# Patient Record
Sex: Female | Born: 1985 | Hispanic: No | Marital: Married | Smoking: Never smoker
Health system: Southern US, Community
[De-identification: ages and names within clinical notes are randomized; demographics above are authoritative.]

## PROBLEM LIST (undated history)

## (undated) DIAGNOSIS — Z789 Other specified health status: Secondary | ICD-10-CM

## (undated) HISTORY — DX: Other specified health status: Z78.9

## (undated) HISTORY — PX: EYE SURGERY: SHX253

---

## 2014-11-30 ENCOUNTER — Emergency Department (HOSPITAL_COMMUNITY)
Admission: EM | Admit: 2014-11-30 | Discharge: 2014-11-30 | Disposition: A | Discharge status: Home / Self Care | Payer: Medicaid Other | Attending: Emergency Medicine | Admitting: Emergency Medicine

## 2014-11-30 ENCOUNTER — Encounter (HOSPITAL_COMMUNITY): Payer: Self-pay | Admitting: Nurse Practitioner

## 2014-11-30 DIAGNOSIS — R21 Rash and other nonspecific skin eruption: Secondary | ICD-10-CM | POA: Diagnosis present

## 2014-11-30 MED ORDER — TRIAMCINOLONE ACETONIDE 0.1 % EX CREA: 1.0000 "application " | TOPICAL_CREAM | Freq: Two times a day (BID) | CUTANEOUS | Status: DC

## 2014-11-30 MED ORDER — PREDNISONE 20 MG PO TABS: 40.0000 mg | ORAL_TABLET | Freq: Every day | ORAL | Status: DC

## 2014-11-30 NOTE — ED Notes (Addendum)
She c/o "hot itchy rash" to bilateral hands x 1 week. She tried hydrocortisone cream, clariting with no relief. No rash noted to rest of body. She speaks nepali.

## 2014-11-30 NOTE — ED Provider Notes (Signed)
CSN: [id number]     Arrival date & time 11/30/14  1131 History  By signing my name below, I, Freida Busman, attest that this documentation has been prepared under the direction and in the presence of non-physician practitioner, Sharilyn Sites, PA-C. Electronically Signed: Freida Busman, Scribe. 11/30/2014. 12:27 PM.  Chief Complaint  Patient presents with  . Rash    The history is provided by the patient. A language interpreter was used.   HPI Comments:  Brooke Hobbs is a 29 y.o. female who presents to the Emergency Department complaining of burning pruritic rash to bilateral dorsal hands for 2 weeks. She applied topical cream and has taken OTC meds with mild relief. She denies use of new detergents and lotions.  No chemical exposures.  Pt has no other complaints or symptoms at this time.  No fever, chills, sweats. No known allergens.  VSS.  Pt speaks Nepali; Language line utilized to obtain history  History reviewed. No pertinent past medical history. History reviewed. No pertinent past surgical history. History reviewed. No pertinent family history. Social History  Substance Use Topics  . Smoking status: Never Smoker   . Smokeless tobacco: None  . Alcohol Use: No   OB History    No data available     Review of Systems  Constitutional: Negative for fever and chills.  Respiratory: Negative for shortness of breath.   Cardiovascular: Negative for chest pain.  Skin: Positive for rash.  All other systems reviewed and are negative.   Allergies  Review of patient's allergies indicates no known allergies.  Home Medications   Prior to Admission medications   Not on File   BP 122/93 mmHg  Pulse 87  Temp(Src) 97.8 F (36.6 C) (Oral)  Resp 16  Wt 123 lb 6.4 oz (55.974 kg)  SpO2 100%   Physical Exam  Constitutional: She is oriented to person, place, and time. She appears well-developed and well-nourished.  HENT:  Head: Normocephalic and atraumatic.  Mouth/Throat:  Oropharynx is clear and moist.  No oral lesions, no oral swelling, handling secretions well  Eyes: Conjunctivae and EOM are normal. Pupils are equal, round, and reactive to light.  Neck: Normal range of motion.  Cardiovascular: Normal rate, regular rhythm and normal heart sounds.   Pulmonary/Chest: Effort normal and breath sounds normal. No respiratory distress. She has no wheezes.  Musculoskeletal: Normal range of motion.  Neurological: She is alert and oriented to person, place, and time.  Skin: Skin is warm and dry. Rash noted. Rash is maculopapular.  Maculopapular rash noted to bilateral dorsal hands, areas of excoriation noted; no signs of superimposed infection or cellulitis; no abscess formation; no streaking of hands/arms; no lesions on palms; no other rashes noted patient repetitively scratching during exam  Psychiatric: She has a normal mood and affect.  Nursing note and vitals reviewed.   ED Course  Procedures   DIAGNOSTIC STUDIES:  Oxygen Saturation is 100% on RA, normal by my interpretation.    COORDINATION OF CARE:  12:22 PM Will discharge with topical and oral medications as well as OB and PCP referral. Discussed treatment plan with pt at bedside and pt agreed to plan.   MDM   Final diagnoses:  Rash of hands   29 year old female here with pruritic rash to bilateral dorsal hands for the past 2 weeks.  Rash is noninfectious in appearance. She has no lesions on her palms. No oral lesions or oral swelling. Patient is repetitively scratching her hands during exam. This is most  consistent with contact dermatitis. We'll start her on triamcinolone ointment as well as prednisone taper. She was encouraged to take Benadryl for itching. Patient also requests referral to OB/GYN as her husband are having issues getting pregnant over the past several years. She denies any current gynecological issues, but would like evaluation for infertility. [facility] I have also given her referral to wellness clinic for follow-up of rash.  Discussed plan with patient, he/she acknowledged understanding and agreed with plan of care.  Return precautions given for new or worsening symptoms.  I personally performed the services described in this documentation, which was scribed in my presence. The recorded information has been reviewed and is accurate.  Garlon HatchetLisa M Antonious Omahoney, PA-C 11/30/14 1336  Mancel BaleElliott Wentz, MD 11/30/14 1921

## 2014-11-30 NOTE — Discharge Instructions (Signed)
Take the prescribed medication as directed. [facility] of [facility] call to make appt. Return to the ED for new or worsening symptoms.

## 2015-02-28 ENCOUNTER — Encounter: Payer: Self-pay | Admitting: Obstetrics and Gynecology

## 2015-02-28 ENCOUNTER — Ambulatory Visit (INDEPENDENT_AMBULATORY_CARE_PROVIDER_SITE_OTHER): Payer: Medicaid Other | Admitting: Obstetrics and Gynecology

## 2015-02-28 VITALS — BP 124/86 | HR 97 | Temp 98.8°F | Ht <= 58 in | Wt 121.7 lb

## 2015-02-28 DIAGNOSIS — N915 Oligomenorrhea, unspecified: Secondary | ICD-10-CM | POA: Insufficient documentation

## 2015-02-28 HISTORY — DX: Oligomenorrhea, unspecified: N91.5

## 2015-02-28 LAB — TSH: TSH: 1.38 m[IU]/L

## 2015-02-28 LAB — POCT PREGNANCY, URINE: Preg Test, Ur: NEGATIVE

## 2015-02-28 MED ORDER — METFORMIN HCL 1000 MG PO TABS: 1000.0000 mg | ORAL_TABLET | Freq: Two times a day (BID) | ORAL | Status: DC

## 2015-02-28 NOTE — Progress Notes (Signed)
Used [employer] [phone].  Patient states has always had irregular periods. Has been trying to get pregnant x 5-6 years.

## 2015-02-28 NOTE — Progress Notes (Signed)
Patient ID: Brooke Hobbs, female   DOB: 27-Jun-1985, 30 y.o.   MRN: [id number] 30 yo here for the evaluation of irregular menses. Patient reports irregular menses since menarche. She typically has a period every 1- 4 months. Her periods last 4-6 days. She has been prescribed OCP in the past to help regulate her cycle. Her last period was in September. She has been trying to conceive without success for the past 5 years. She denies frequent headaches, visual changes or female pattern hair distribution. She denies milky discharge from her breast  History reviewed. No pertinent past medical history. History reviewed. No pertinent past surgical history. History reviewed. No pertinent family history. Social History  Substance Use Topics  . Smoking status: Never Smoker   . Smokeless tobacco: Former Neurosurgeon    Types: Chew  . Alcohol Use: No   ROS See pertinent in HPI  Blood pressure 124/86, pulse 97, temperature 98.8 F (37.1 C), height  (1.422 m), weight 121 lb 11.2 oz (55.203 kg), last menstrual period 09/28/2014. GENERAL: Well-developed, well-nourished female in no acute distress. Obese ABDOMEN: Soft, nontender, nondistended. PELVIC: Normal external female genitalia. Vagina is pink and rugated.  Normal discharge. Normal appearing cervix. Uterus is normal in size. No adnexal mass or tenderness. EXTREMITIES: No cyanosis, clubbing, or edema, 2+ distal pulses.  A/P 30 yo with oligomenorrhea - TSH, prolactin, LH and FSH ordered - Pelvic ultrasound ordered - Rx Metformin provided for now - Patient will have her husband tested as well - Patient will be contacted with any abnormal results

## 2015-03-01 LAB — LUTEINIZING HORMONE: LH: 18.9 m[IU]/mL

## 2015-03-01 LAB — FOLLICLE STIMULATING HORMONE: FSH: 5.2 m[IU]/mL

## 2015-03-01 LAB — PROLACTIN: Prolactin: 7.1 ng/mL

## 2015-03-05 ENCOUNTER — Telehealth: Payer: Self-pay | Admitting: *Deleted

## 2015-03-05 NOTE — Telephone Encounter (Signed)
Called pt w/[employer] interpreter # [phone]. She was informed that Dr. Jolayne Panther has reviewed her lab tests from last week and all results were normal. Pt should keep appt for [place] as scheduled on 2/20. Dr. Jolayne Panther would like pt to return for follow up visit on 3/10 @ 0900. Pt agreed and voiced understanding of all information and instructions.

## 2015-03-11 ENCOUNTER — Ambulatory Visit (HOSPITAL_COMMUNITY)
Admission: RE | Admit: 2015-03-11 | Discharge: 2015-03-11 | Disposition: A | Discharge status: Home / Self Care | Payer: Self-pay | Source: Ambulatory Visit | Attending: Obstetrics and Gynecology | Admitting: Obstetrics and Gynecology

## 2015-03-11 DIAGNOSIS — N915 Oligomenorrhea, unspecified: Secondary | ICD-10-CM | POA: Insufficient documentation

## 2015-03-29 ENCOUNTER — Ambulatory Visit: Payer: Self-pay | Admitting: Obstetrics and Gynecology

## 2015-04-18 ENCOUNTER — Encounter: Payer: Self-pay | Admitting: Obstetrics and Gynecology

## 2015-04-18 ENCOUNTER — Ambulatory Visit (INDEPENDENT_AMBULATORY_CARE_PROVIDER_SITE_OTHER): Payer: Self-pay | Admitting: Obstetrics and Gynecology

## 2015-04-18 VITALS — BP 118/89 | HR 89 | Temp 98.4°F | Ht <= 58 in | Wt 120.2 lb

## 2015-04-18 DIAGNOSIS — N915 Oligomenorrhea, unspecified: Secondary | ICD-10-CM

## 2015-04-18 MED ORDER — MEDROXYPROGESTERONE ACETATE 10 MG PO TABS: 10.0000 mg | ORAL_TABLET | Freq: Every day | ORAL | Status: DC

## 2015-04-18 MED ORDER — CLOMIPHENE CITRATE 50 MG PO TABS: ORAL_TABLET | ORAL | Status: DC

## 2015-04-18 NOTE — Progress Notes (Signed)
Patient ID: Brooke Hobbs, female   DOB: 01/14/1986, 30 y.o.   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 Patient reports onset of a normal period on 03/22/2015. Patient is still interested in conceiving.  Blood pressure 118/89, pulse 89, temperature 98.4 F (36.9 C), temperature source Oral, height 4\' 10"  (1.473 m), weight 120 lb 3.2 oz (54.522 kg), last menstrual period 03/22/2015. GENERAL: Well-developed, well-nourished female in no acute distress.  EXTREMITIES: No cyanosis, clubbing, or edema, 2+ distal pulses. NEURO: Alert and oriented x 3. No focal deficits  2/20 Ultrasound FINDINGS: Uterus  Measurements: 6.4 x 2.9 x 3.8 cm. No fibroids or other mass visualized.  Endometrium  Thickness: 6 mm in thickness. No focal abnormality visualized.  Right ovary  Measurements: 2.7 x 2.0 x 1.6 cm. Normal appearance/no adnexal mass.  Left ovary  Measurements: 2.4 x 1.9 x 1.8 cm. Normal appearance/no adnexal mass.  Other findings  Small amount of free fluid in the pelvis  IMPRESSION: Unremarkable pelvic ultrasound.   A/P 30 yo with oligomenorrhea - Lab and ultrasound results reviewed and explained - Patient is interested in trying clomid. Rx 50 mg provided along with Provera 10 mg to initiate a menses - Instructions were reviewed with the patient who was able to repeat theses instructions to me - Patient was informed that her husband should have a semen analysis if this fails and that she will be transferred to an infertility specialist

## 2015-04-18 NOTE — Progress Notes (Signed)
[employer]pre[employer] [phone]

## 2015-05-02 ENCOUNTER — Ambulatory Visit
Admission: RE | Admit: 2015-05-02 | Discharge: 2015-05-02 | Disposition: A | Discharge status: Home / Self Care | Payer: No Typology Code available for payment source | Source: Ambulatory Visit | Attending: Infectious Disease | Admitting: Infectious Disease

## 2015-05-02 ENCOUNTER — Other Ambulatory Visit: Payer: Self-pay | Admitting: Infectious Disease

## 2015-05-02 DIAGNOSIS — R7611 Nonspecific reaction to tuberculin skin test without active tuberculosis: Secondary | ICD-10-CM

## 2016-12-14 ENCOUNTER — Ambulatory Visit (HOSPITAL_COMMUNITY)
Admission: EM | Admit: 2016-12-14 | Discharge: 2016-12-14 | Disposition: A | Discharge status: Home / Self Care | Payer: Self-pay | Attending: Physician Assistant | Admitting: Physician Assistant

## 2016-12-14 ENCOUNTER — Ambulatory Visit (INDEPENDENT_AMBULATORY_CARE_PROVIDER_SITE_OTHER): Payer: Self-pay

## 2016-12-14 ENCOUNTER — Encounter (HOSPITAL_COMMUNITY): Payer: Self-pay | Admitting: Emergency Medicine

## 2016-12-14 DIAGNOSIS — J40 Bronchitis, not specified as acute or chronic: Secondary | ICD-10-CM

## 2016-12-14 MED ORDER — ALBUTEROL SULFATE HFA 108 (90 BASE) MCG/ACT IN AERS: 1.0000 | INHALATION_SPRAY | Freq: Four times a day (QID) | RESPIRATORY_TRACT | 0 refills | Status: DC | PRN

## 2016-12-14 MED ORDER — AZITHROMYCIN 250 MG PO TABS: 250.0000 mg | ORAL_TABLET | Freq: Every day | ORAL | 0 refills | Status: DC

## 2016-12-14 MED ORDER — PREDNISONE 10 MG PO TABS: 40.0000 mg | ORAL_TABLET | Freq: Every day | ORAL | 0 refills | Status: AC

## 2016-12-14 NOTE — ED Triage Notes (Signed)
Pt here with cough and some blood tinged sputum at times; pt sts pain with cough

## 2016-12-14 NOTE — ED Provider Notes (Signed)
[facility]    CSN: [id number] Arrival date & time: 12/14/16  1050     History   Chief Complaint Chief Complaint  Patient presents with  . Cough    HPI Brooke Hobbs is a 31 y.o. female.   31 year-old female, presenting today due to cough. She states that she has had cough for the past two weeks. Over the past few days, she has noticed assoicatec chest pain with coughing as well as blood-tinged sputum. Husband at home with similar symptoms. No fever, chills, runny nose   The history is provided by the patient.  Cough  Cough characteristics:  Productive Sputum characteristics:  Clear and bloody Severity:  Mild Onset quality:  Gradual Duration:  2 weeks Timing:  Constant Progression:  Unchanged Chronicity:  New Smoker: no   Context: sick contacts   Context: not animal exposure, not exposure to allergens and not fumes   Relieved by:  Nothing Worsened by:  Nothing Ineffective treatments:  None tried Associated symptoms: chest pain   Associated symptoms: no chills, no diaphoresis, no ear fullness, no ear pain, no eye discharge, no fever, no headaches, no myalgias, no rash, no rhinorrhea, no shortness of breath, no sinus congestion, no sore throat, no weight loss and no wheezing   Risk factors: no chemical exposure, no recent infection and no recent travel     History reviewed. No pertinent past medical history.  Patient Active Problem List   Diagnosis Date Noted  . Oligomenorrhea 02/28/2015    History reviewed. No pertinent surgical history.  OB History    Gravida Para Term Preterm AB Living   0 0 0 0 0 0   SAB TAB Ectopic Multiple Live Births   0 0 0 0         Home Medications    Prior to Admission medications   Medication Sig Start Date End Date Taking? Authorizing Provider  albuterol (PROVENTIL HFA;VENTOLIN HFA) 108 (90 Base) MCG/ACT inhaler Inhale 1-2 puffs into the lungs every 6 (six) hours as needed for wheezing or shortness of breath.  12/14/16   Jasa Dundon C, PA-C  azithromycin (ZITHROMAX) 250 MG tablet Take 1 tablet (250 mg total) by mouth daily. Take first 2 tablets together, then 1 every day until finished. 12/14/16   Simran Mannis C, PA-C  clomiPHENE (CLOMID) 50 MG tablet Take 1 tablet daily for 5 days starting on day 5 of menstrual cycle 04/18/15   Constant, Peggy, MD  medroxyPROGESTERone (PROVERA) 10 MG tablet Take 1 tablet (10 mg total) by mouth daily. 04/18/15   Constant, Peggy, MD  metFORMIN (GLUCOPHAGE) 1000 MG tablet Take 1 tablet (1,000 mg total) by mouth 2 (two) times daily with a meal. 02/28/15   Constant, Peggy, MD  predniSONE (DELTASONE) 10 MG tablet Take 4 tablets (40 mg total) by mouth daily for 5 days. 12/14/16 12/19/16  Alecia LemmingBlue, Aceson Labell C, PA-C    Family History History reviewed. No pertinent family history.  Social History Social History   Tobacco Use  . Smoking status: Never Smoker  . Smokeless tobacco: Former NeurosurgeonUser    Types: Chew  Substance Use Topics  . Alcohol use: No  . Drug use: No     Allergies   Patient has no known allergies.   Review of Systems Review of Systems  Constitutional: Negative for chills, diaphoresis, fever and weight loss.  HENT: Negative for ear pain, rhinorrhea and sore throat.   Eyes: Negative for pain, discharge and visual disturbance.  Respiratory:  Positive for cough. Negative for shortness of breath and wheezing.   Cardiovascular: Positive for chest pain. Negative for palpitations.  Gastrointestinal: Negative for abdominal pain and vomiting.  Genitourinary: Negative for dysuria and hematuria.  Musculoskeletal: Negative for arthralgias, back pain and myalgias.  Skin: Negative for color change and rash.  Neurological: Negative for seizures, syncope and headaches.  All other systems reviewed and are negative.    Physical Exam Triage Vital Signs ED Triage Vitals [12/14/16 1119]  Enc Vitals Group     BP 113/75     Pulse Rate 98     Resp 18     Temp 98.7 F (37.1  C)     Temp Source Oral     SpO2 100 %     Weight      Height      Head Circumference      Peak Flow      Pain Score      Pain Loc      Pain Edu?      Excl. in GC?    No data found.  Updated Vital Signs BP 113/75 (BP Location: Left Arm)   Pulse 98   Temp 98.7 F (37.1 C) (Oral)   Resp 18   LMP 12/13/2016 (Exact Date)   SpO2 100%   Visual Acuity Right Eye Distance:   Left Eye Distance:   Bilateral Distance:    Right Eye Near:   Left Eye Near:    Bilateral Near:     Physical Exam  Constitutional: She appears well-developed and well-nourished. No distress.  HENT:  Head: Normocephalic and atraumatic.  Right Ear: Hearing, tympanic membrane, external ear and ear canal normal.  Left Ear: Hearing, tympanic membrane, external ear and ear canal normal.  Nose: Nose normal.  Mouth/Throat: Uvula is midline and oropharynx is clear and moist.  Eyes: Conjunctivae are normal.  Neck: Neck supple.  Cardiovascular: Normal rate and regular rhythm.  No murmur heard. Pulmonary/Chest: Effort normal and breath sounds normal. No stridor. No respiratory distress. She has no decreased breath sounds. She has no wheezes. She has no rhonchi. She has no rales. She exhibits tenderness.    Abdominal: Soft. There is no tenderness.  Musculoskeletal: She exhibits no edema.  Neurological: She is alert.  Skin: Skin is warm and dry.  Psychiatric: She has a normal mood and affect.  Nursing note and vitals reviewed.    UC Treatments / Results  Labs (all labs ordered are listed, but only abnormal results are displayed) Labs Reviewed - No data to display  EKG  EKG Interpretation None       Radiology Dg Chest 2 View  Result Date: 12/14/2016 CLINICAL DATA:  Cough, hemoptysis with blood-tinged sputum at times for 2 weeks, pain with coughing, fever for couple days EXAM: CHEST  2 VIEW COMPARISON:  05/02/2015 FINDINGS: Normal heart size, mediastinal contours, and pulmonary vascularity. Mild  peribronchial thickening, chronic. No pulmonary infiltrate, pleural effusion, or pneumothorax. Bones unremarkable. IMPRESSION: Mild chronic bronchitic changes without infiltrate. Electronically Signed   By: Ulyses SouthwardMark  Boles M.D.   On: 12/14/2016 12:01    Procedures Procedures (including critical care time)  Medications Ordered in UC Medications - No data to display   Initial Impression / Assessment and Plan / UC Course  I have reviewed the triage vital signs and the nursing notes.  Pertinent labs & imaging results that were available during my care of the patient were reviewed by me and considered in my medical decision  making (see chart for details).     CXR shows likely bronchitis  No evidence of pneumonia Will treat with zpak due to duration of symptoms Prednisone and albuterol for cough   Final Clinical Impressions(s) / UC Diagnoses   Final diagnoses:  Bronchitis    ED Discharge Orders        Ordered    predniSONE (DELTASONE) 10 MG tablet  Daily     12/14/16 1217    azithromycin (ZITHROMAX) 250 MG tablet  Daily     12/14/16 1217    albuterol (PROVENTIL HFA;VENTOLIN HFA) 108 (90 Base) MCG/ACT inhaler  Every 6 hours PRN     12/14/16 1217       Controlled Substance Prescriptions [place] Controlled Substance Registry consulted? Not Applicable   Alecia Lemming, [place] 12/14/16 1225

## 2017-07-29 IMAGING — US US PELVIS COMPLETE
1 series · 15 of 25 positions shown · non-contrast
Comparison: None

CLINICAL DATA: Oligo menorrhea. Last menstrual cycle last [REDACTED]

EXAM:
TRANSABDOMINAL AND TRANSVAGINAL ULTRASOUND OF PELVIS
TECHNIQUE: Both transabdominal and transvaginal ultrasound examinations of the
pelvis were performed. Transabdominal technique was performed for
global imaging of the pelvis including uterus, ovaries, adnexal
regions, and pelvic cul-de-sac. It was necessary to proceed with
endovaginal exam following the transabdominal exam to visualize the
uterus, endometrium, ovaries and adnexa .

[Series 1: us pelvis complete · 15 of 31 slices shown]
[im 1/31]
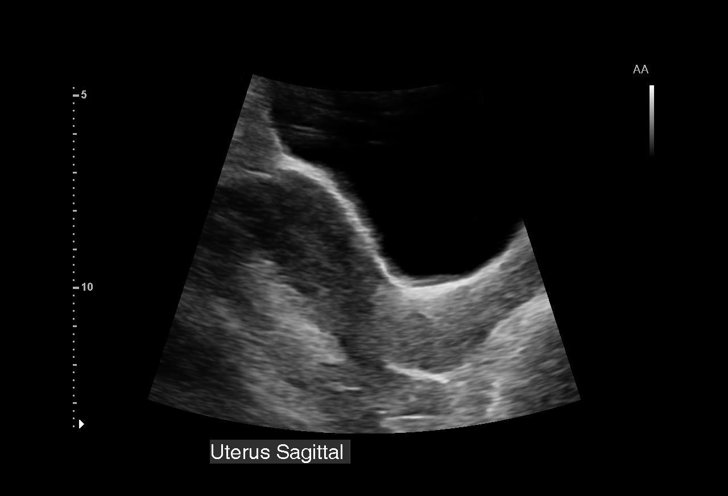
[im 3/31]
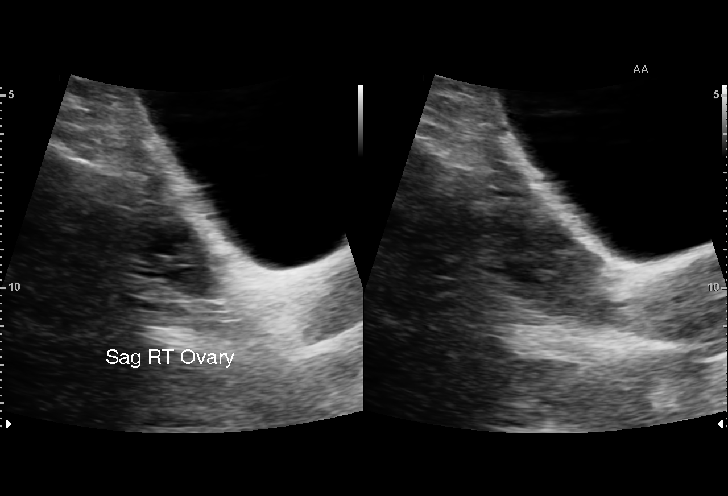
[im 6/31]
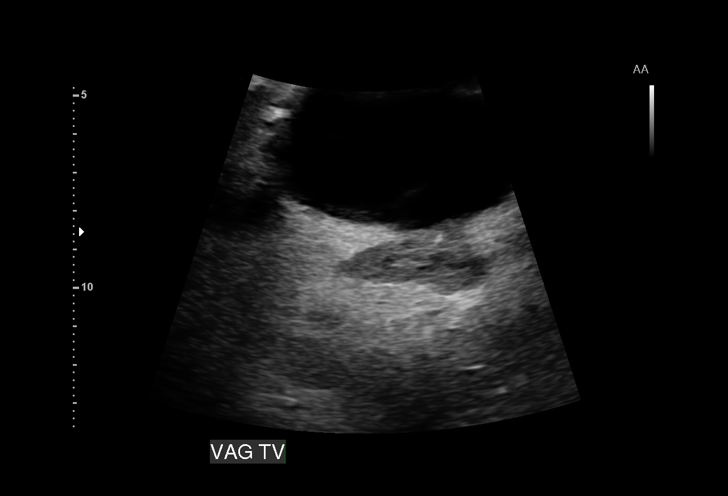
[im 7/31]
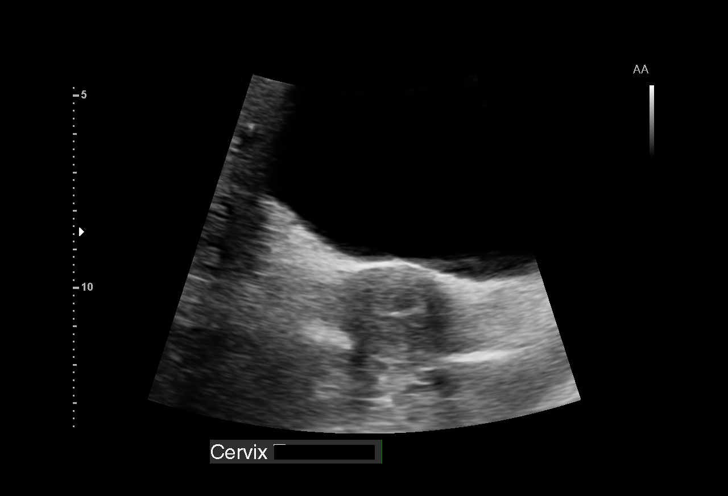
[im 9/31]
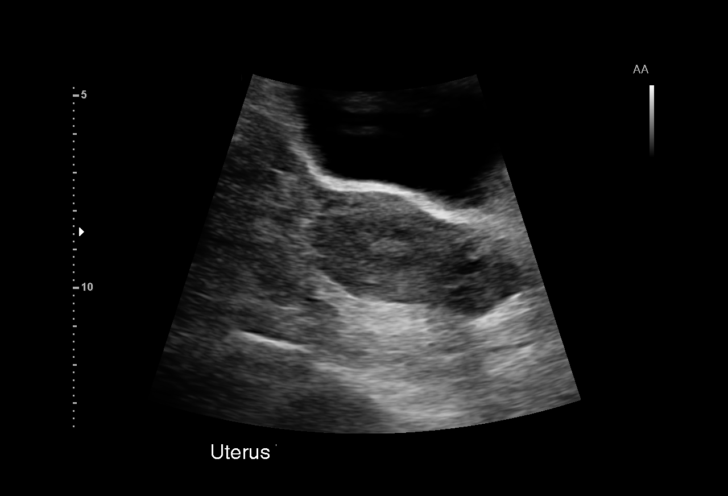
[im 12/31]
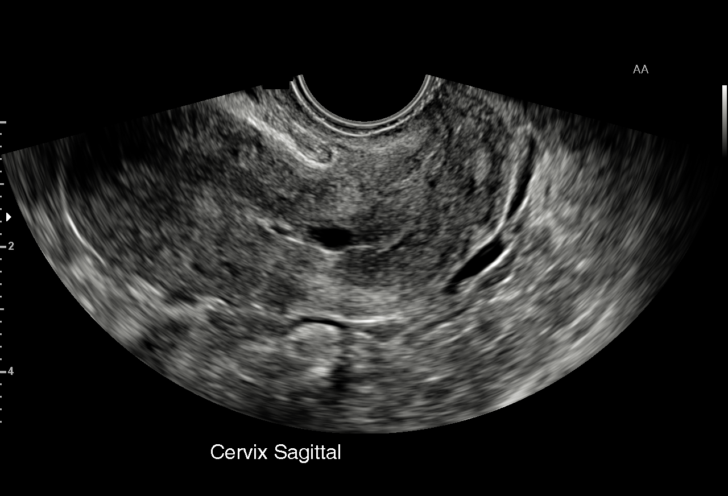
[im 13/31]
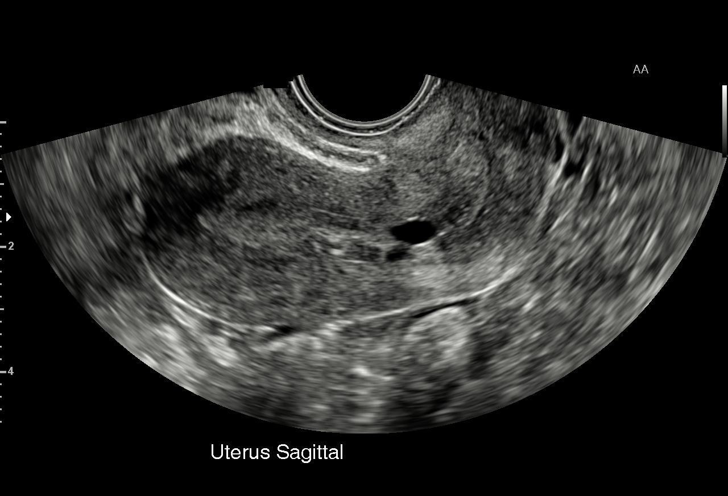
[im 16/31]
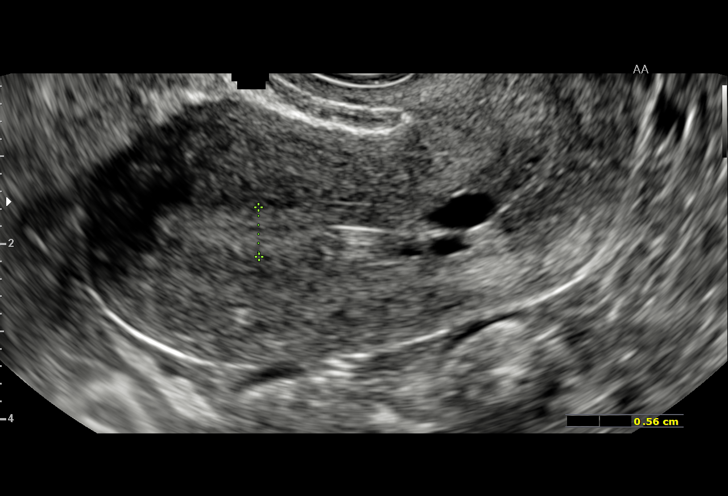
[im 18/31]
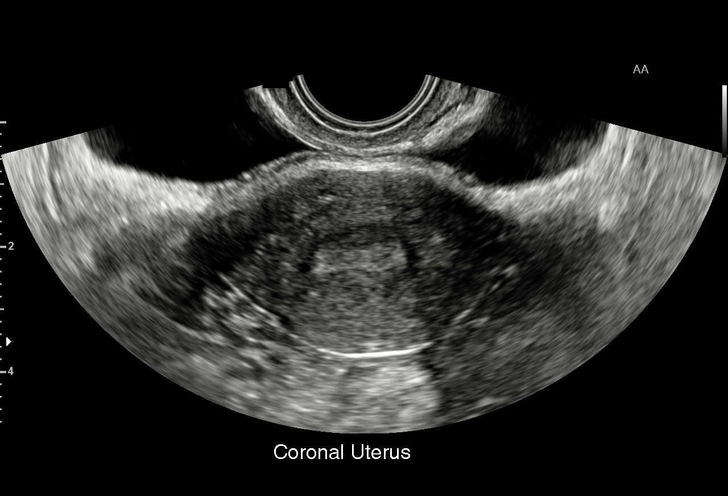
[im 19/31]
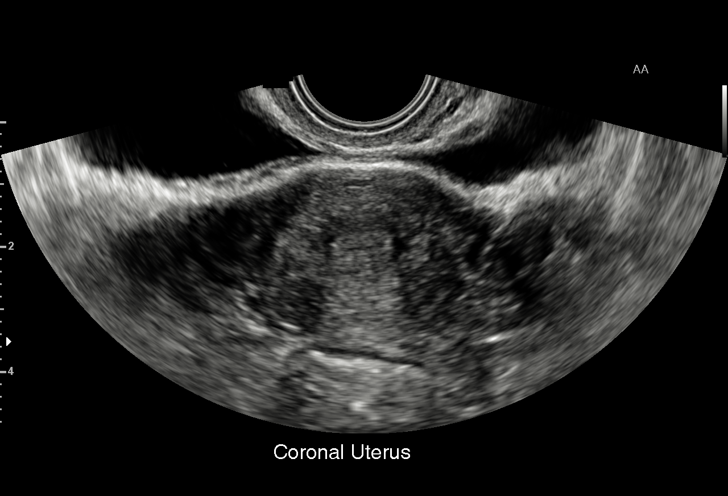
[im 22/31]
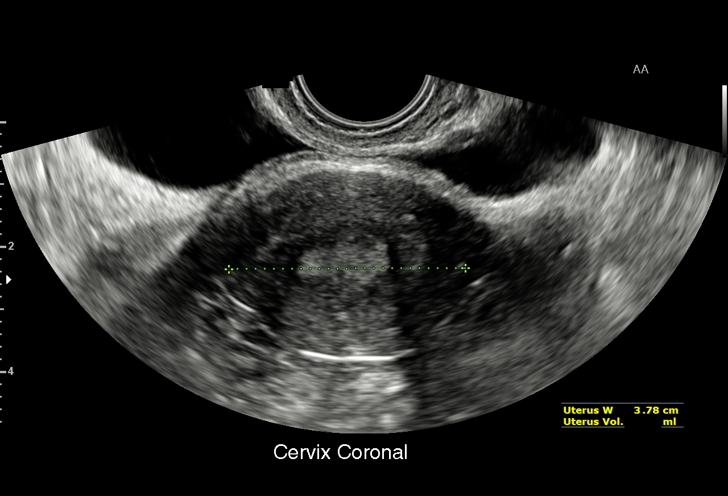
[im 24/31]
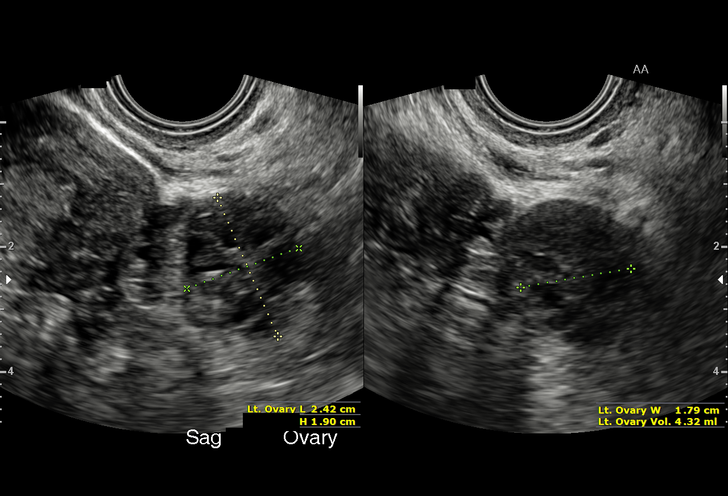
[im 26/31]
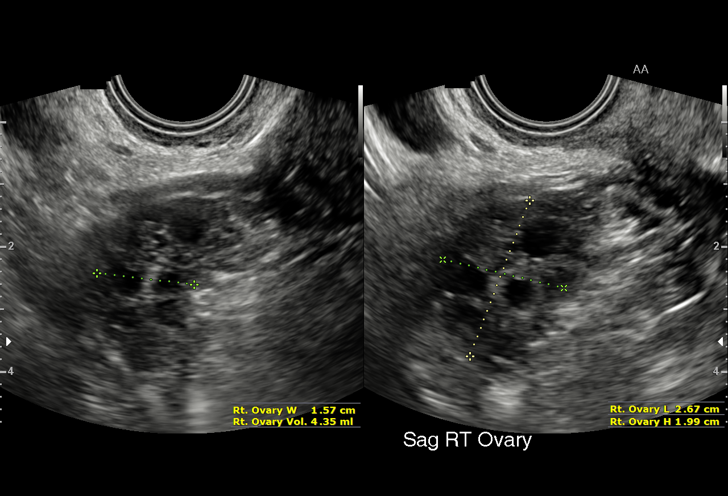
[im 28/31]
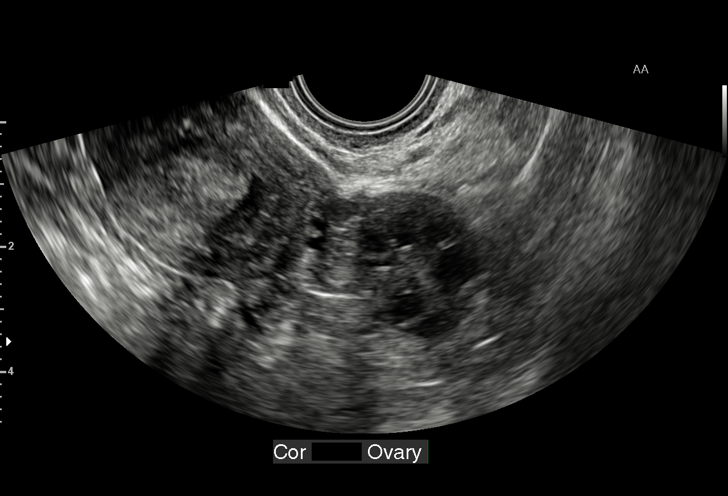
[im 31/31]
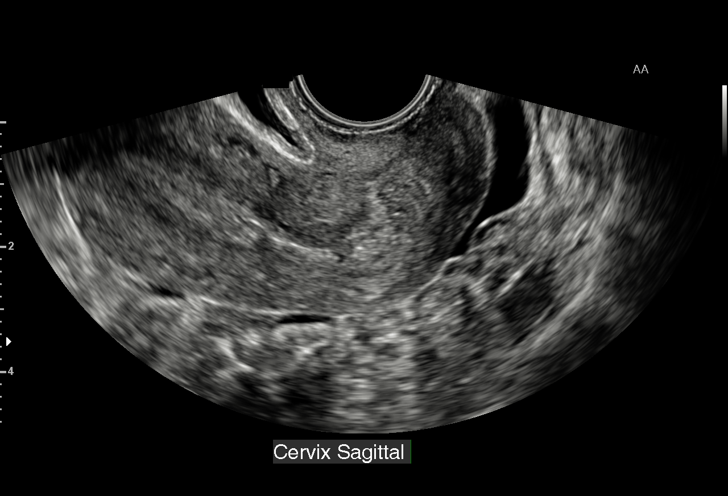

[15 of 25 positions shown; findings below may reference images not displayed]

FINDINGS: Uterus

Measurements: 6.4 x 2.9 x 3.8 cm. No fibroids or other mass
visualized.

Endometrium

Thickness: 6 mm in thickness.  No focal abnormality visualized.

Right ovary

Measurements: 2.7 x 2.0 x 1.6 cm. Normal appearance/no adnexal mass.

Left ovary

Measurements: 2.4 x 1.9 x 1.8 cm. Normal appearance/no adnexal mass.

Other findings

Small amount of free fluid in the pelvis
IMPRESSION: Unremarkable pelvic ultrasound.

## 2018-10-04 ENCOUNTER — Other Ambulatory Visit: Payer: Self-pay

## 2018-10-04 DIAGNOSIS — Z20822 Contact with and (suspected) exposure to covid-19: Secondary | ICD-10-CM

## 2018-10-06 LAB — NOVEL CORONAVIRUS, NAA: SARS-CoV-2, NAA: NOT DETECTED

## 2019-04-10 ENCOUNTER — Ambulatory Visit: Payer: Self-pay | Attending: Internal Medicine

## 2019-04-10 DIAGNOSIS — Z23 Encounter for immunization: Secondary | ICD-10-CM

## 2019-04-10 NOTE — Progress Notes (Signed)
   Covid-19 Vaccination Clinic  Name:  Ranell Finelli    MRN: [id number] DOB: 1985-10-08  04/10/2019  Ms. Stilley was observed post Covid-19 immunization for 15 minutes without incident. She was provided with Vaccine Information Sheet and instruction to access the V-Safe system.   Ms. Whitworth was instructed to call 911 with any severe reactions post vaccine: Marland Kitchen Difficulty breathing  . Swelling of face and throat  . A fast heartbeat  . A bad rash all over body  . Dizziness and weakness   Immunizations Administered    Name Date Dose VIS Date Route   Pfizer COVID-19 Vaccine 04/10/2019 10:38 AM 0.3 mL 12/30/2018 Intramuscular   Manufacturer: [employer], [employer]   Lot: [id number]   NDC: [address]-4

## 2019-05-04 IMAGING — DX DG CHEST 2V
2 series · 2 of 2 positions shown · non-contrast
Comparison: 05/02/2015

CLINICAL DATA: Cough, hemoptysis with blood-tinged sputum at times
for 2 weeks, pain with coughing, fever for couple days

EXAM:
CHEST  2 VIEW

[chest pa]
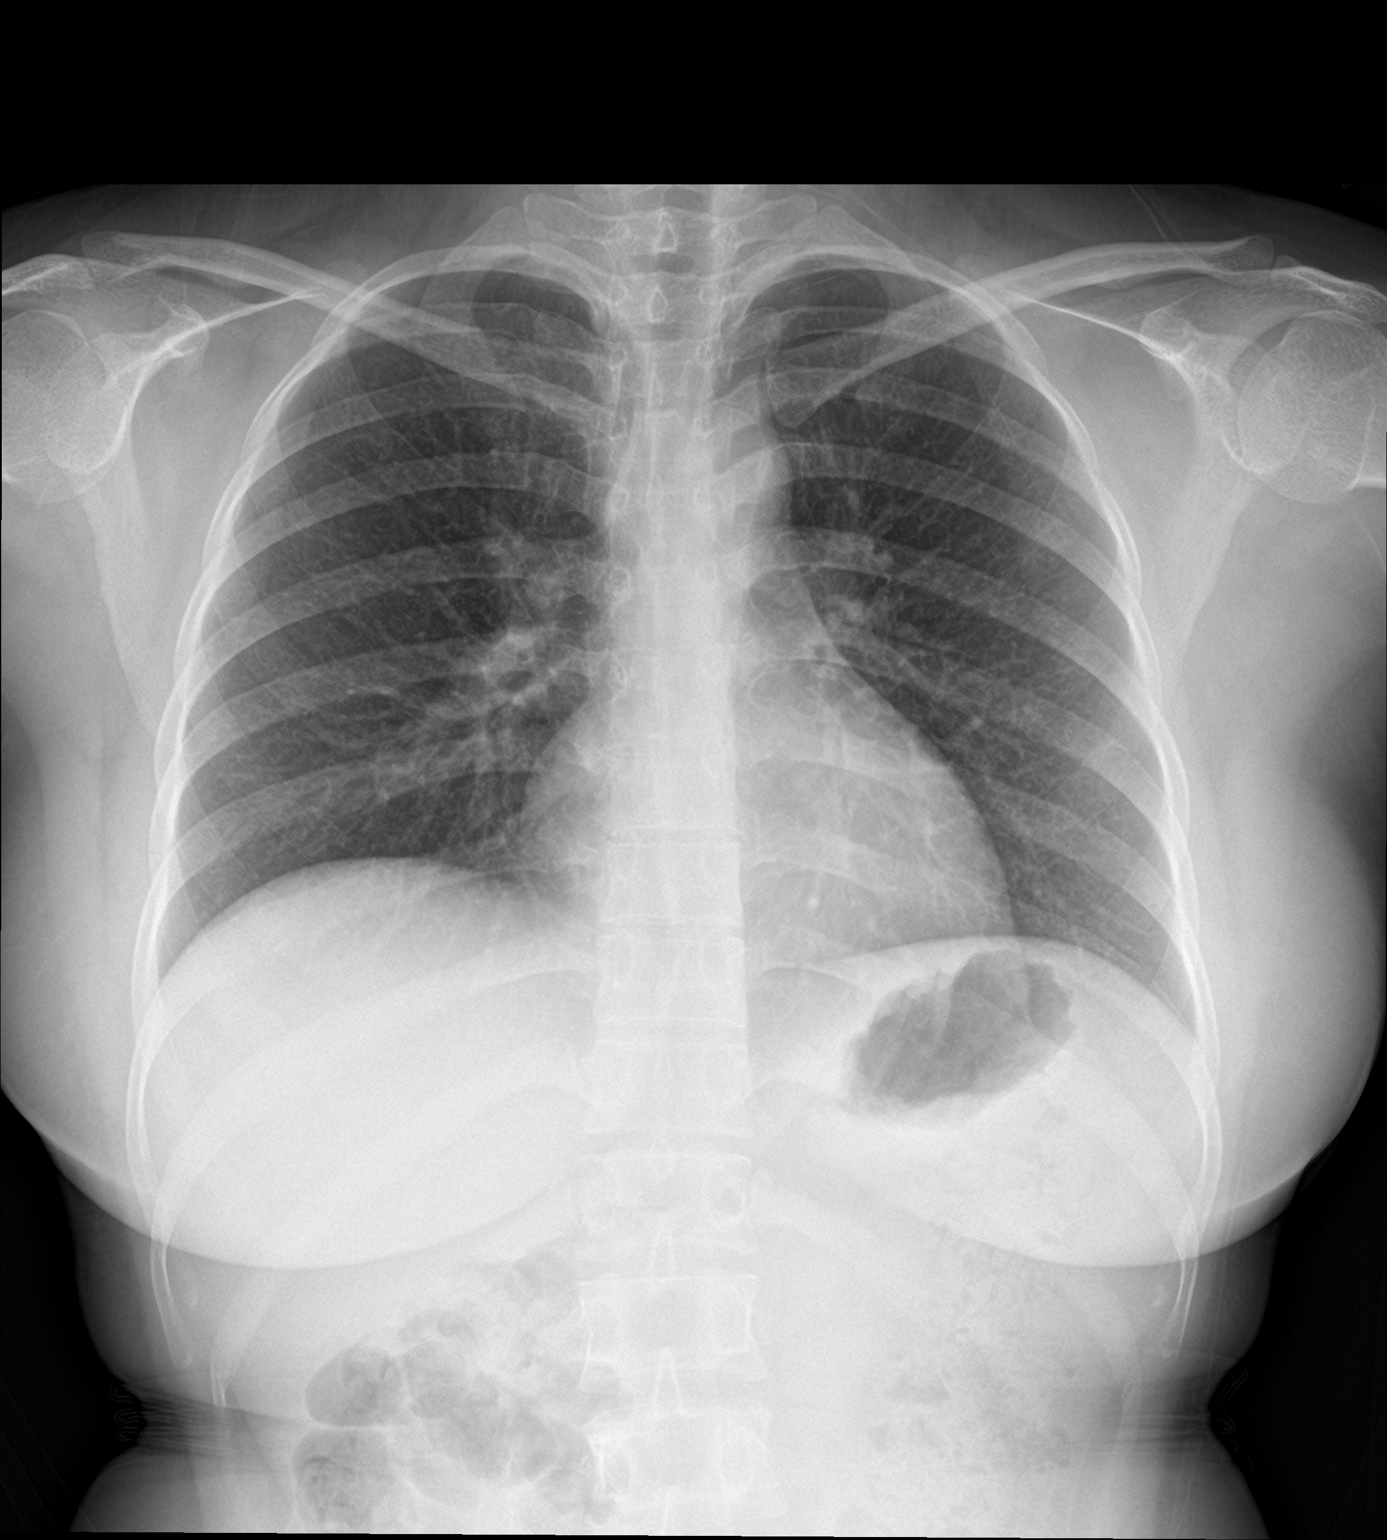

[chest lat]
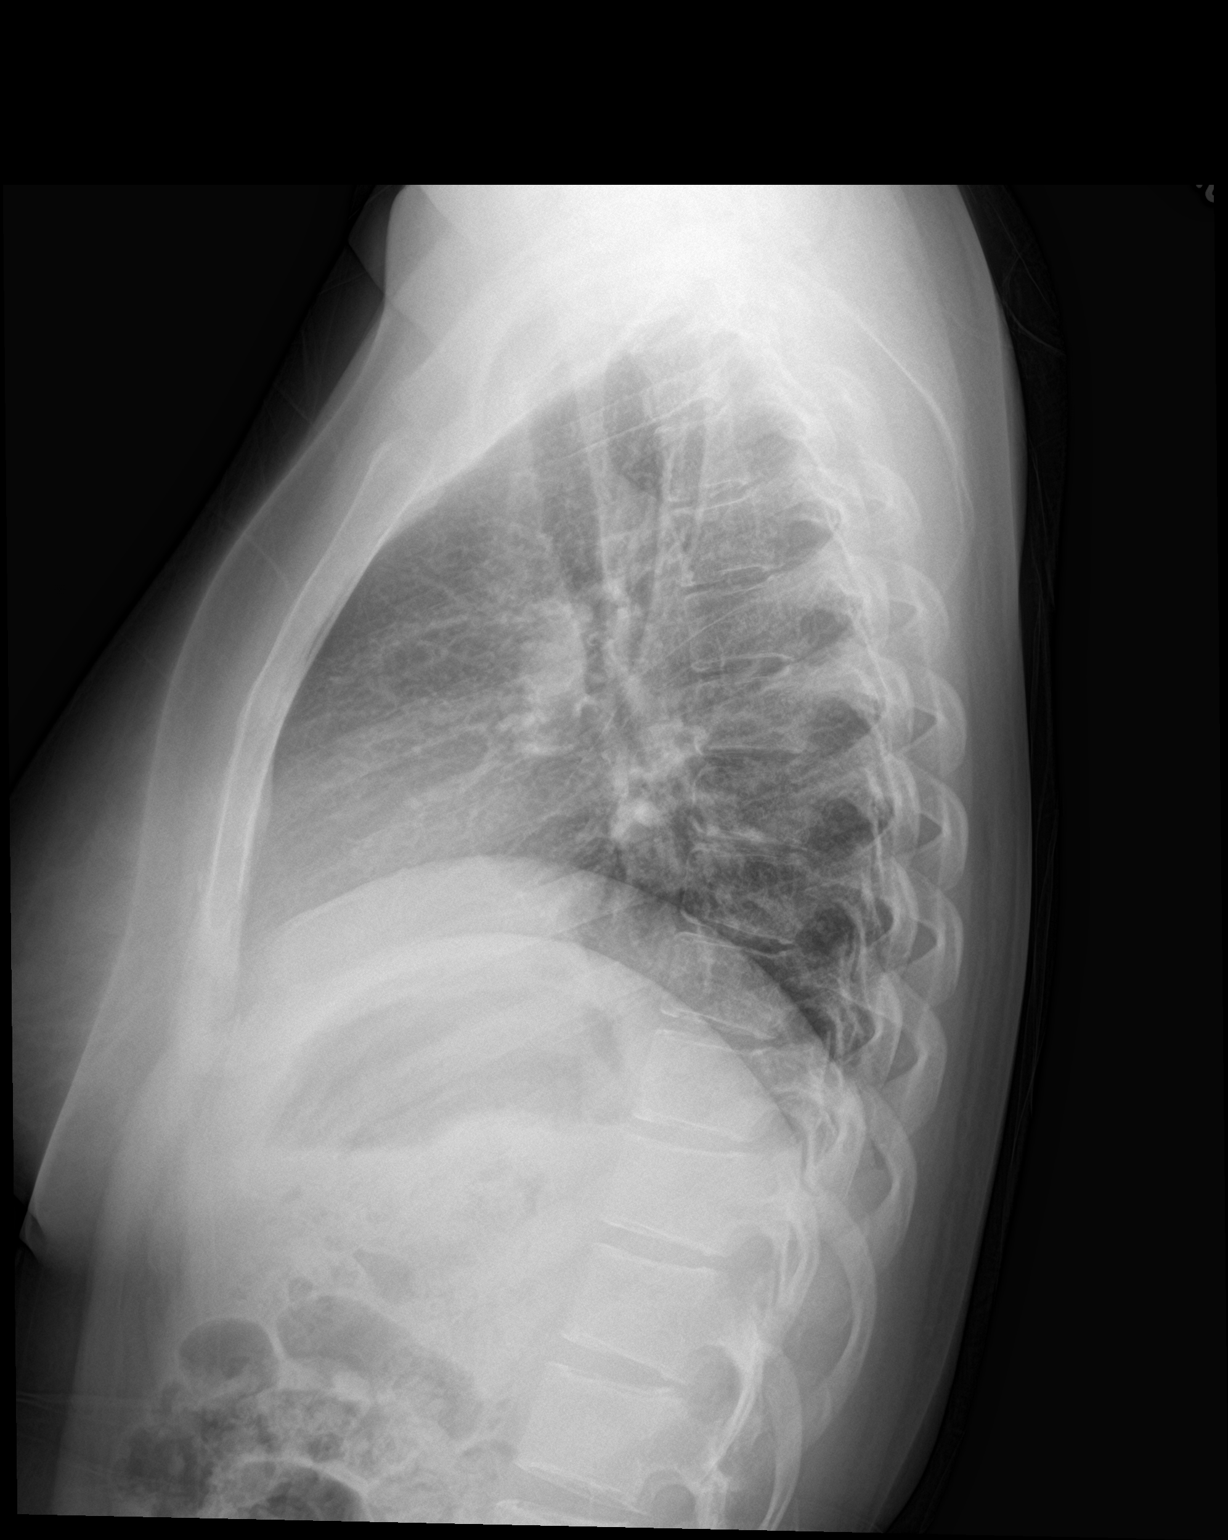

[2 of 2 positions shown; findings below may reference images not displayed]

FINDINGS: Normal heart size, mediastinal contours, and pulmonary vascularity.

Mild peribronchial thickening, chronic.

No pulmonary infiltrate, pleural effusion, or pneumothorax.

Bones unremarkable.
IMPRESSION: Mild chronic bronchitic changes without infiltrate.

## 2019-05-08 ENCOUNTER — Ambulatory Visit: Payer: Self-pay | Attending: Internal Medicine

## 2019-05-08 DIAGNOSIS — Z23 Encounter for immunization: Secondary | ICD-10-CM

## 2019-05-08 NOTE — Progress Notes (Signed)
   Covid-19 Vaccination Clinic  Name:  Dafina Suk    MRN: [id number] DOB: 04/26/1985  05/08/2019  Ms. Crilly was observed post Covid-19 immunization for 15 minutes without incident. She was provided with Vaccine Information Sheet and instruction to access the V-Safe system.   Ms. Matera was instructed to call 911 with any severe reactions post vaccine: Marland Kitchen Difficulty breathing  . Swelling of face and throat  . A fast heartbeat  . A bad rash all over body  . Dizziness and weakness   Immunizations Administered    Name Date Dose VIS Date Route   Pfizer COVID-19 Vaccine 05/08/2019  9:18 AM 0.3 mL 03/15/2018 Intramuscular   Manufacturer: [employer], [employer]   Lot: [id number]   NDC: [address]-4

## 2019-07-18 ENCOUNTER — Ambulatory Visit: Payer: Self-pay | Admitting: Obstetrics and Gynecology

## 2019-08-10 ENCOUNTER — Encounter: Payer: Self-pay | Admitting: Obstetrics and Gynecology

## 2019-08-10 ENCOUNTER — Other Ambulatory Visit (HOSPITAL_COMMUNITY)
Admission: RE | Admit: 2019-08-10 | Discharge: 2019-08-10 | Disposition: A | Discharge status: Home / Self Care | Payer: Self-pay | Source: Ambulatory Visit | Attending: Obstetrics and Gynecology | Admitting: Obstetrics and Gynecology

## 2019-08-10 ENCOUNTER — Ambulatory Visit (INDEPENDENT_AMBULATORY_CARE_PROVIDER_SITE_OTHER): Payer: Self-pay | Admitting: Obstetrics and Gynecology

## 2019-08-10 ENCOUNTER — Other Ambulatory Visit: Payer: Self-pay

## 2019-08-10 VITALS — BP 125/81 | HR 95 | Ht <= 58 in | Wt 131.9 lb

## 2019-08-10 DIAGNOSIS — N914 Secondary oligomenorrhea: Secondary | ICD-10-CM | POA: Insufficient documentation

## 2019-08-10 DIAGNOSIS — N915 Oligomenorrhea, unspecified: Secondary | ICD-10-CM

## 2019-08-10 NOTE — Progress Notes (Signed)
Pt has Live Animator

## 2019-08-10 NOTE — Progress Notes (Signed)
   Subjective:    Patient ID: Brooke Hobbs is a 34 y.o. female presenting with Gynecologic Exam  on 08/10/2019  HPI: Pt still continues with oligomenorrhea.  Through interpreter she notes the progesterone appeared to work 4 years ago, but she discontinued it due to cramping.  Currently, pt has menses q 2-3 months.  Menses usually lasts one day.  Pt denies hot flashes or nipple leakage.  Pt has been trying to get pregnant for 8 years, but still has not evaluated female factor. No past surgical history on file.  No past medical history on file.  No family history on file.   All reviewed   Review of Systems  Constitutional: Negative.   HENT: Negative.   Eyes: Negative.   Respiratory: Negative.   Cardiovascular: Negative.   Gastrointestinal: Negative.   Endocrine: Negative.   Genitourinary: Positive for menstrual problem. Negative for vaginal bleeding.  Musculoskeletal: Negative.   Allergic/Immunologic: Negative.   Neurological: Negative.   Psychiatric/Behavioral: Negative.       Objective:    BP 125/81   Pulse 95   Ht 4\' 9"  (1.448 m)   Wt 131 lb 14.4 oz (59.8 kg)   LMP 02/28/2007 (Approximate)   BMI 28.54 kg/m  Physical Exam Constitutional:      Appearance: Normal appearance.  HENT:     Head: Normocephalic and atraumatic.     Nose: Nose normal.     Mouth/Throat:     Mouth: Mucous membranes are moist.  Eyes:     Extraocular Movements: Extraocular movements intact.  Cardiovascular:     Rate and Rhythm: Normal rate and regular rhythm.     Heart sounds: Normal heart sounds.  Pulmonary:     Effort: Pulmonary effort is normal.     Breath sounds: Normal breath sounds.  Abdominal:     Palpations: Abdomen is soft.     Comments: Very thick abdomen, but no masses detected, no striae noted, no fluid wave  Genitourinary:    General: Normal vulva.     Vagina: No vaginal discharge.     Comments: Pap taken without incident, cvx: WNL, uterus and adnexa WNL, no abnormal discharge  noted Musculoskeletal:        General: Normal range of motion.     Cervical back: Normal range of motion.  Skin:    General: Skin is warm and dry.  Neurological:     General: No focal deficit present.     Mental Status: She is alert and oriented to person, place, and time.  Psychiatric:        Mood and Affect: Mood normal.         Assessment & Plan:  Oligomenorrhea Will repeat TSH, prolactin, FSH, LH and serum preg test If normal will repeat progesterone challenge and retry clomid 3-5 months If ineffective, pt will need fertility referral Pt urged to have partner seek out semen evaluation  Return in about 1 month (around 09/10/2019) for in person, f/u.  09/12/2019 08/10/2019 3:53 PM

## 2019-08-11 LAB — TSH: TSH: 1.8 u[IU]/mL (ref 0.450–4.500)

## 2019-08-11 LAB — BETA HCG QUANT (REF LAB): hCG Quant: <1 m[IU]/mL

## 2019-08-11 LAB — AMENORRHEA PROFILE
FSH: 5.5 m[IU]/mL
LH: 28.3 m[IU]/mL
Prolactin: 10 ng/mL (ref 4.8–23.3)

## 2019-08-14 LAB — CYTOLOGY - PAP
Adequacy: ABSENT
Comment: NEGATIVE
Diagnosis: NEGATIVE
High risk HPV: NEGATIVE

## 2019-09-13 ENCOUNTER — Ambulatory Visit (INDEPENDENT_AMBULATORY_CARE_PROVIDER_SITE_OTHER): Payer: Self-pay | Admitting: Obstetrics and Gynecology

## 2019-09-13 ENCOUNTER — Encounter: Payer: Self-pay | Admitting: Obstetrics and Gynecology

## 2019-09-13 ENCOUNTER — Other Ambulatory Visit: Payer: Self-pay

## 2019-09-13 VITALS — BP 115/88 | HR 98 | Ht <= 58 in | Wt 131.6 lb

## 2019-09-13 DIAGNOSIS — N970 Female infertility associated with anovulation: Secondary | ICD-10-CM

## 2019-09-13 DIAGNOSIS — N914 Secondary oligomenorrhea: Secondary | ICD-10-CM

## 2019-09-13 MED ORDER — CLOMIPHENE CITRATE 50 MG PO TABS: ORAL_TABLET | ORAL | 3 refills | Status: DC

## 2019-09-13 MED ORDER — MEDROXYPROGESTERONE ACETATE 10 MG PO TABS
10.0000 mg | ORAL_TABLET | Freq: Every day | ORAL | 2 refills | Status: DC
Start: 2019-09-13 — End: 2019-10-09

## 2019-09-13 NOTE — Progress Notes (Signed)
   Subjective:    Patient ID: Brooke Hobbs, female    DOB: [birth year], 34 y.o.   MRN: [id number]  HPI  Pt seen regarding oligomenorrhea.  Hormonal panel was normal.  She is not currently pregnant.  Pt has been trying off and on for 8 years to get pregnant.  She states her sister in law has a similar issue with lack of menses.  Discussed with pt progesterone challenge and clomid use in detail.  Also discussed need for semenanalysis for her spouse.    Review of Systems     Objective:   Physical Exam        Assessment & Plan:   1. Secondary oligomenorrhea Take provera with expectation of withdrawal menses 3-4 days after stopping therapy - medroxyPROGESTERone (PROVERA) 10 MG tablet; Take 1 tablet (10 mg total) by mouth daily. Use for ten days  Dispense: 10 tablet; Refill: 2  2. Infertility associated with anovulation Pt advised to get serum progesterone on or around day 14.  Pt advised to have timed intercourse every other day for one week starting on day 14. - clomiPHENE (CLOMID) 50 MG tablet; Take 1 tablet daily for 5 days starting on day 5 of menstrual cycle  Dispense: 5 tablet; Refill: 3   Advise follow up in 3-4 months or with sign of positive pregnancy test  Total face-to-face time with patient: 20 minutes. Over 50% of encounter was spent on counseling and coordination of care.  Mariel Aloe, MD  Faculty attending Center for [facility]

## 2019-09-13 NOTE — Patient Instructions (Signed)
Clomid challenge/therapy Given that patient has anovulatory cycles, discussed Clomid challenge/therapy.  She was given Rx for Provera 10mg po qd x 10 days.   She was told to watch for withdrawal bleeding after the course, the first day of bleeding will be Day 1 for her next cycle.  She was also given a Rx for Clomid 50mg po qd to take on days 5-9 of her cycle.  The risks of Clomid including ovarian hyperstimulation with possible risk of ovarian cancer as well as multiple gestation were discussed with patient.  Patient also advised to continue frequent intercourse especially around Day 14 of her upcoming cycle (qod intercourse around days 9 - 18).  By Day 35, patient was told to call if she had her period.  If patient has bleeding at end of cycle, will increase Clomid by 50mg for the following cycle; she can have a total of 6 cycles.  However if patient does not have bleeding, she was told to do a pregnancy test/come in for evaluation.   CLOMID PATIENT INSTRUCTIONS  WHY USE IT? Clomid helps your ovaries to release eggs (ovulate).  HOW TO USE IT? Clomid is taken as a pill usually on days 5,6,7,8, & 9 of your cycle.  Day 1 is the first day of your period. The dose or duration may be changed to achieve ovulation.  Provera (progesterone) may first be used to bring on a period for some patients.  If you do not get pregnant this cycle, for your next cycles, take on days 1, 2, 3, 4 and 5.  If you do not get a period, take Provera 10 mg daily for 10 days to bring on a period; the first day you get bleeding is Day 1 of your cycle. The day of ovulation on Clomid is usually between cycle day 14 and 17.  Having sexual intercourse at least every other day between cycle day 13 and 18 will improve your chances of becoming pregnant during the Clomid cycle.  You may monitor your ovulation using basal body temperature charts or with ovulation kits.  If using the ovulation predictor kits, having intercourse the day of the  surge and the two days following is recommended. If you get your period, call when it starts for an appointment with your doctor, so that an exam may be done, and another Clomid cycle can be considered if appropriate. If you do not get a period by day 35 of the cycle, please get a blood pregnancy test.  If it is negative, speak to your doctor for instructions to bring on another period and to plan a follow-up appointment.  THINGS TO KNOW: If you get pregnant while using Clomid, your chance of twins is 7% and triplets is less than 1%. Some studies have suggested the use of "fertility drugs" may increase your risk of ovarian cancers in the future.  It is unclear if these drugs increase the risk, or people who have problems with fertility are prone for these cancers.  If there is an actual risk, it is very low.  If you have a history of liver problems or ovarian cancer, it may be wise to avoid this medication.  SIDE EFFECTS: The most common side effect is hot flashes (20%). Breast tenderness, headaches, nausea, bloating may also occur at different times. Less than 3/1,000 people have dryness or loss of hair. Persistent ovarian cysts may form from the use of this medication. Ovarian hyperstimulation syndrome is a rare side effect at low   doses. Visual changes like flashes of light or blurring.      

## 2019-10-07 ENCOUNTER — Other Ambulatory Visit: Payer: Self-pay | Admitting: Obstetrics and Gynecology

## 2019-10-07 DIAGNOSIS — N914 Secondary oligomenorrhea: Secondary | ICD-10-CM

## 2019-10-12 ENCOUNTER — Ambulatory Visit (INDEPENDENT_AMBULATORY_CARE_PROVIDER_SITE_OTHER): Payer: Self-pay | Admitting: Obstetrics and Gynecology

## 2019-10-12 ENCOUNTER — Encounter: Payer: Self-pay | Admitting: Family Medicine

## 2019-10-12 ENCOUNTER — Other Ambulatory Visit: Payer: Self-pay

## 2019-10-12 VITALS — BP 113/67 | HR 96 | Wt 131.0 lb

## 2019-10-12 DIAGNOSIS — Z789 Other specified health status: Secondary | ICD-10-CM | POA: Insufficient documentation

## 2019-10-12 DIAGNOSIS — N914 Secondary oligomenorrhea: Secondary | ICD-10-CM

## 2019-10-12 MED ORDER — MEDROXYPROGESTERONE ACETATE 10 MG PO TABS: 10.0000 mg | ORAL_TABLET | Freq: Every day | ORAL | 6 refills | Status: DC

## 2019-10-12 NOTE — Progress Notes (Signed)
   Subjective:    Patient ID: Brooke Hobbs, female    DOB: March 15, 1985, 34 y.o.   MRN: [id number]  HPI  Pt seen as a follow up.  Pt has taken her provera incorrectly.  Instead of taking the provera 7-10 days, the pt has been taking it continuously and has gone through 2 refills.  The patient never picked up her clomid.  LMP 9/17-9/23.  First menses since February.  Through interpreter , I repeatedly explained how to take the progesterone and the metformin.  Discussed clomid on days 5-9, timed intercourse starting on day 14 and day 21 progesterone  Review of Systems     Objective:   Physical Exam        Assessment & Plan:   Oligomenorrhea : provera 10 mg daily for 7 days once per month  Infertility : start clomid on day 5 of spontaneous cycle or withdrawal bleed.  Pt may call to discuss timing of medications, but she does not need office visit for each menses. Otherwise follow up in 3-4 months. Total face-to-face time with patient: 20 minutes. Over 50% of encounter was spent on counseling and coordination of care.

## 2019-10-12 NOTE — Patient Instructions (Addendum)
[facility] [employer]  Department [facility] [employer] [facility] [employer] [address] [place], [place] [address] [phone]   or  www.https://hall.info/ **SNAP/EBT/ Other nutritional benefits  [facility] [address] [place], [place], [place] [address] [phone]  or  https://king.net/ **WIC for  women who are pregnant and postpartum, infants and children up to 32 years old  [facility] [address], [place], [place] [address] [phone]   or   www.theblessedtable.org  **Food pantry  [employer] [address] [place], [place], [place] [address] [phone]   or   https://brotherkolbes.godaddysites.com  **Emergency food and prepared meals  [address] [place] [employer] [facility] [address], [place], [place] [address] [phone]   or   www.cedargrovetop.us **Food pantry  [facility] [address], [place], [place] [address] [phone]   or   www.GolfingFamily.no **Food pantry  [facility] [address] [place], [place] [address] [phone] **Food pantry  [facility] [address], [place], [place] [address] [phone]   or   www.greensborourbanministry.org  Tour manager and prepared meals  [facility] [address] [place], [address], [place], [place] [address] VerifiedMovies.gl  **Food pantry  [employer] [facility] [address] [place], [place] [address] [phone]   or   www.lbcnow.org  **Food pantry  [employer] [facility] [address], [place], [place] [address] [phone]   or   WorkingMBA.co.nz **Food pantry, nutrition education, gardening activities  [facility] [address], [place], [place] [address] [phone] **Food pantry  [facility] [place] [address] [place], [place] [address] [phone]   or   www.salvationarmyof[place].[employer] [employer] [facility] [place] [address] [place], [place] [address] [phone]   or   http://senior-resources-[place].org [employer] Food on Wheels Program  [facility] [address], [place], [place] [address] [phone]   or   www.stmattchurch.com  **Food pantry  [facility] [address] [place], [place] [address] [phone]   or   vandaliapresbyterianchurch.org **Food pantry  Clomiphene tablets What is this medicine? CLOMIPHENE (KLOE mi feen) is a fertility drug that increases the chance of pregnancy. It helps women ovulate (produce a mature egg) during their cycle. This medicine may be used for other purposes; ask your health care provider or pharmacist if you have questions. COMMON BRAND NAME(S): Clomid, Serophene What should I tell my health care provider before I take this medicine? They need to know if you have any of these conditions:  adrenal gland disease  blood vessel disease or blood clots  cyst on the ovary  endometriosis  liver disease  ovarian cancer  pituitary gland disease  vaginal bleeding that has not been evaluated  an unusual or allergic reaction to clomiphene, other medicines, foods, dyes, or preservatives  pregnant (should not be used if you are already pregnant)  breast-feeding How should I use this medicine? Take this medicine by mouth with a glass of water. Follow the directions on the prescription label. Take exactly as directed for the exact number of days prescribed. Take your doses at regular intervals. Most women take this medicine for a 5 day period, but the length of treatment may be adjusted. Your doctor will give you a start date for this medication and  will give you instructions on proper use. Do not take your medicine more often than directed. Talk to your  pediatrician regarding the use of this medicine in children. Special care may be needed. Overdosage: If you think you have taken too much of this medicine contact a poison control center or emergency room at once. NOTE: This medicine is only for you. Do not share this medicine with others. What if I miss a dose? If you miss a dose, take it as soon as you can. If it is almost time for your next dose, take only that dose. Do not take double or extra doses. What may interact with this medicine?  herbal or dietary supplements, like blue cohosh, black cohosh, chasteberry, or DHEA  prasterone This list may not describe all possible interactions. Give your health care provider a list of all the medicines, herbs, non-prescription drugs, or dietary supplements you use. Also tell them if you smoke, drink alcohol, or use illegal drugs. Some items may interact with your medicine. What should I watch for while using this medicine? Make sure you understand how and when to use this medicine. You need to know when you are ovulating and when to have sexual intercourse. This will increase the chance of a pregnancy. Visit your doctor or health care professional for regular checks on your progress. You may need tests to check the hormone levels in your blood or you may have to use home-urine tests to check for ovulation. Try to keep any appointments. Compared to other fertility treatments, this medicine does not greatly increase your chances of having multiple babies. An increased chance of having twins may occur in roughly 5 out of every 100 women who take this medication. Stop taking this medicine at once and contact your doctor or health care professional if you think you are pregnant. This medicine is not for long-term use. Most women that benefit from this medicine do so within the first three cycles  (months). Your doctor or health care professional will monitor your condition. This medicine is usually used for a total of 6 cycles of treatment. You may get drowsy or dizzy. Do not drive, use machinery, or do anything that needs mental alertness until you know how this drug affects you. Do not stand or sit up quickly. This reduces the risk of dizzy or fainting spells. Drinking alcoholic beverages or smoking tobacco may decrease your chance of becoming pregnant. Limit or stop alcohol and tobacco use during your fertility treatments. What side effects may I notice from receiving this medicine? Side effects that you should report to your doctor or health care professional as soon as possible:  allergic reactions like skin rash, itching or hives, swelling of the face, lips, or tongue  breathing problems  changes in vision  fluid retention  nausea, vomiting  pelvic pain or bloating  severe abdominal pain  sudden weight gain Side effects that usually do not require medical attention (report to your doctor or health care professional if they continue or are bothersome):  breast discomfort  hot flashes  mild pelvic discomfort  mild nausea This list may not describe all possible side effects. Call your doctor for medical advice about side effects. You may report side effects to FDA at 1-800-FDA-1088. Where should I keep my medicine? Keep out of the reach of children. Store at room temperature between 15 and 30 degrees C (59 and 86 degrees F). Protect from heat, light, and moisture. Throw away any unused medicine after the expiration date. NOTE: This sheet is a summary. It may not cover all possible information. If you have questions  about this medicine, talk to your doctor, pharmacist, or health care provider.  2020 Elsevier/Gold Standard (2007-04-18 22:21:06)

## 2019-10-17 ENCOUNTER — Telehealth (INDEPENDENT_AMBULATORY_CARE_PROVIDER_SITE_OTHER): Payer: Self-pay | Admitting: Lactation Services

## 2019-10-17 DIAGNOSIS — N970 Female infertility associated with anovulation: Secondary | ICD-10-CM

## 2019-10-17 MED ORDER — CLOMIPHENE CITRATE 50 MG PO TABS: 50.0000 mg | ORAL_TABLET | Freq: Every day | ORAL | 3 refills | Status: DC

## 2019-10-17 NOTE — Telephone Encounter (Signed)
Patient called with concerns about getting her medication. Requesting a call back.

## 2019-10-17 NOTE — Addendum Note (Signed)
Addended by: Ed Blalock on: 10/17/2019 03:41 PM   Modules accepted: Orders

## 2019-10-17 NOTE — Telephone Encounter (Addendum)
Returned patients call. Patient was in the room and another person was speaking as an interpreter. She reports she is a friend and was calling for the patient who identified herself as being present during the call.   Patient did not receive her Clomid. Chart review reveals prescription was printed. Prescription reordered and sent to Pharmacy.

## 2020-05-02 ENCOUNTER — Other Ambulatory Visit: Payer: Self-pay | Admitting: Obstetrics and Gynecology

## 2020-05-18 ENCOUNTER — Ambulatory Visit: Payer: Self-pay | Admitting: Family Medicine

## 2020-08-07 ENCOUNTER — Encounter: Payer: Self-pay | Admitting: Obstetrics and Gynecology

## 2020-08-07 ENCOUNTER — Ambulatory Visit (INDEPENDENT_AMBULATORY_CARE_PROVIDER_SITE_OTHER): Payer: Self-pay | Admitting: Obstetrics and Gynecology

## 2020-08-07 ENCOUNTER — Other Ambulatory Visit: Payer: Self-pay

## 2020-08-07 VITALS — BP 129/94 | HR 99 | Wt 134.7 lb

## 2020-08-07 DIAGNOSIS — Z789 Other specified health status: Secondary | ICD-10-CM

## 2020-08-07 DIAGNOSIS — N914 Secondary oligomenorrhea: Secondary | ICD-10-CM

## 2020-08-07 DIAGNOSIS — N979 Female infertility, unspecified: Secondary | ICD-10-CM

## 2020-08-07 MED ORDER — METFORMIN HCL 500 MG PO TABS: ORAL_TABLET | ORAL | 3 refills | Status: DC

## 2020-08-07 NOTE — Progress Notes (Signed)
Patient stated that she did not get her period in the month of May but got it in the month of April and June. She stated that she took and pregnancy test in June that resulted "negative"

## 2020-08-07 NOTE — Progress Notes (Signed)
  CC: irregular menses, fertility Subjective:    Patient ID: Brooke Hobbs, female    DOB: 08-13-1985, 35 y.o.   MRN: [id number]  HPI 35 yo G0 seen for follow up from clomid and provera trial.  Per pt she was able have have menses most months using the provera.  She did try the clomid for several months without success.  Pt only tried metformin once as it made her feel unwell, but she did take it without a meal.As per previous recommendations, female partner still has not received semen analysis.   Review of Systems     Objective:   Physical Exam Vitals:   08/07/20 0945  BP: (!) 129/94  Pulse: 99         Assessment & Plan:   1. Secondary oligomenorrhea Pt may continue provera to help initiate menses and use metformin to help decrease presumed insulin resistance.  Instructions for use given - metFORMIN (GLUCOPHAGE) 500 MG tablet; 500 mg with dinner x 1 week, increase to 1000 mg x 1 week, increase to 1500 mg with dinner daily  Dispense: 90 tablet; Refill: 3  2. Language barrier Interpreter present  3. Infertility, female Due to patient's advancing age and lack of success with clomid, recommend evaluation by REI to assist with fertility.  Pt again told her spouse needs to have semen analysis to eval female factor. - Ambulatory referral to Endocrinology - metFORMIN (GLUCOPHAGE) 500 MG tablet; 500 mg with dinner x 1 week, increase to 1000 mg x 1 week, increase to 1500 mg with dinner daily  Dispense: 90 tablet; Refill: 3  I spent 20 minutes dedicated to the care of this patient including previsit review of records, face to face time with the patient discussing treatment regimen, plan and post visit testing.   Warden Fillers, MD Faculty Attending, [facility]

## 2020-09-18 ENCOUNTER — Telehealth: Payer: Self-pay | Admitting: Lactation Services

## 2020-09-18 ENCOUNTER — Telehealth: Payer: Self-pay | Admitting: Family Medicine

## 2020-09-18 NOTE — Telephone Encounter (Signed)
Called patient with assistance of [employer], # [phone].   Informed patient that Infertility Referral has been faxed to [facility] today and it will take 48 hours for the office to call for a patient.   Patient reports she has already received a call from the [facility] and was informed she needed to call our office back. Reviewed referral has been faxed and [facility] should be calling her back in the next 48 hours to schedule the appointment.

## 2020-09-18 NOTE — Telephone Encounter (Signed)
Patient calling regarding a referral for the Fertility office.

## 2020-09-18 NOTE — Telephone Encounter (Signed)
Faxed Fertility Referral to [facility] per Dr. Laverna Peace last note and at request of patient. Fax confirmation received.

## 2020-10-21 ENCOUNTER — Encounter (HOSPITAL_COMMUNITY): Payer: Self-pay | Admitting: *Deleted

## 2020-10-21 ENCOUNTER — Emergency Department (HOSPITAL_COMMUNITY)
Admission: EM | Admit: 2020-10-21 | Discharge: 2020-10-21 | Disposition: A | Discharge status: Home / Self Care | Payer: Self-pay | Attending: Emergency Medicine | Admitting: Emergency Medicine

## 2020-10-21 ENCOUNTER — Other Ambulatory Visit: Payer: Self-pay

## 2020-10-21 DIAGNOSIS — N9489 Other specified conditions associated with female genital organs and menstrual cycle: Secondary | ICD-10-CM | POA: Insufficient documentation

## 2020-10-21 DIAGNOSIS — R55 Syncope and collapse: Secondary | ICD-10-CM | POA: Insufficient documentation

## 2020-10-21 DIAGNOSIS — R000 Tachycardia, unspecified: Secondary | ICD-10-CM | POA: Insufficient documentation

## 2020-10-21 DIAGNOSIS — R519 Headache, unspecified: Secondary | ICD-10-CM | POA: Insufficient documentation

## 2020-10-21 DIAGNOSIS — R42 Dizziness and giddiness: Secondary | ICD-10-CM | POA: Insufficient documentation

## 2020-10-21 DIAGNOSIS — Z87891 Personal history of nicotine dependence: Secondary | ICD-10-CM | POA: Insufficient documentation

## 2020-10-21 DIAGNOSIS — Z7984 Long term (current) use of oral hypoglycemic drugs: Secondary | ICD-10-CM | POA: Insufficient documentation

## 2020-10-21 LAB — BASIC METABOLIC PANEL
Anion gap: 11 (ref 5–15)
BUN: <5 mg/dL — ABNORMAL LOW (ref 6–20)
CO2: 19 mmol/L — ABNORMAL LOW (ref 22–32)
Calcium: 9.1 mg/dL (ref 8.9–10.3)
Chloride: 106 mmol/L (ref 98–111)
Creatinine, Ser: 0.52 mg/dL (ref 0.44–1.00)
GFR, Estimated: >60 mL/min (ref >60)
Glucose, Bld: 85 mg/dL (ref 70–99)
Potassium: 3.8 mmol/L (ref 3.5–5.1)
Sodium: 136 mmol/L (ref 135–145)

## 2020-10-21 LAB — CBC WITH DIFFERENTIAL/PLATELET
Abs Immature Granulocytes: 0.02 10*3/uL (ref 0.00–0.07)
Basophils Absolute: 0.1 10*3/uL (ref 0.0–0.1)
Basophils Relative: 1 %
Eosinophils Absolute: 0 10*3/uL (ref 0.0–0.5)
Eosinophils Relative: 0 %
HCT: 40.1 % (ref 36.0–46.0)
Hemoglobin: 13.1 g/dL (ref 12.0–15.0)
Immature Granulocytes: 0 %
Lymphocytes Relative: 28 %
Lymphs Abs: 2.4 10*3/uL (ref 0.7–4.0)
MCH: 25.8 pg — ABNORMAL LOW (ref 26.0–34.0)
MCHC: 32.7 g/dL (ref 30.0–36.0)
MCV: 79.1 fL — ABNORMAL LOW (ref 80.0–100.0)
Monocytes Absolute: 0.7 10*3/uL (ref 0.1–1.0)
Monocytes Relative: 8 %
Neutro Abs: 5.2 10*3/uL (ref 1.7–7.7)
Neutrophils Relative %: 63 %
Platelets: 304 10*3/uL (ref 150–400)
RBC: 5.07 MIL/uL (ref 3.87–5.11)
RDW: 14.6 % (ref 11.5–15.5)
WBC: 8.3 10*3/uL (ref 4.0–10.5)
nRBC: 0 % (ref 0.0–0.2)

## 2020-10-21 LAB — URINALYSIS, ROUTINE W REFLEX MICROSCOPIC
Bilirubin Urine: NEGATIVE
Glucose, UA: NEGATIVE mg/dL
Hgb urine dipstick: NEGATIVE
Ketones, ur: 20 mg/dL — AB
Leukocytes,Ua: NEGATIVE
Nitrite: NEGATIVE
Protein, ur: NEGATIVE mg/dL
Specific Gravity, Urine: 1.004 — ABNORMAL LOW (ref 1.005–1.030)
pH: 6 (ref 5.0–8.0)

## 2020-10-21 LAB — I-STAT BETA HCG BLOOD, ED (MC, WL, AP ONLY): I-stat hCG, quantitative: <5 m[IU]/mL (ref <5)

## 2020-10-21 NOTE — ED Provider Notes (Signed)
[facility] EMERGENCY DEPARTMENT Provider Note   CSN: [id number] Arrival date & time: 10/21/20  1504     History Chief Complaint  Patient presents with   Near Syncope    Brooke Hobbs is a 35 y.o. female who presents for dizziness and feelings of presyncope for 1 day.  The patient states that on Mondays she fasts and has not had anything to eat today.  She a describes the episode as occurring while she was at work this afternoon.  She was standing at the time and started to feel dizzy, requiring her to sit down.  Initially the dizziness did not improve with sitting however over the course of the afternoon it has.  She does endorse a mild headache associated with the dizziness.  When asked about her medications, the patient does state that she began taking metformin around 2 months ago as prescribed by her OB/Gyn.  She increased the dose to 1500 mg/day around 3 weeks ago.  She reports never having episodes like this in the past.   History reviewed. No pertinent past medical history.  Patient Active Problem List   Diagnosis Date Noted   Infertility, female 08/07/2020   Language barrier 10/12/2019   Oligomenorrhea 02/28/2015    History reviewed. No pertinent surgical history.   OB History     Gravida  0   Para  0   Term  0   Preterm  0   AB  0   Living  0      SAB  0   IAB  0   Ectopic  0   Multiple  0   Live Births              History reviewed. No pertinent family history.  Social History   Tobacco Use   Smoking status: Never   Smokeless tobacco: Former    Types: Chew  Substance Use Topics   Alcohol use: No   Drug use: No    Home Medications Prior to Admission medications   Medication Sig Start Date End Date Taking? Authorizing Provider  clomiPHENE (CLOMID) 50 MG tablet Take 1 tablet daily for 5 days starting on day 5 of menstrual cycle 09/13/19   Warden Fillers, MD  clomiPHENE (CLOMID) 50 MG tablet TAKE 1 TABLET BY MOUTH  DAILY STARTING ON DAY 5 OF YOUR MENSTRUAL CYCLE 05/02/20   Warden Fillers, MD  medroxyPROGESTERone (PROVERA) 10 MG tablet Take 1 tablet (10 mg total) by mouth daily. 10/12/19   Warden Fillers, MD  metFORMIN (GLUCOPHAGE) 500 MG tablet 500 mg with dinner x 1 week, increase to 1000 mg x 1 week, increase to 1500 mg with dinner daily 08/07/20   Warden Fillers, MD    Allergies    Patient has no known allergies.  Review of Systems   Review of Systems  Neurological:  Positive for dizziness, weakness, light-headedness and headaches. Negative for syncope.  All other systems reviewed and are negative.  Physical Exam Updated Vital Signs BP 119/88 (BP Location: Left Arm)   Pulse 80   Resp 16   SpO2 100%   Constitutional: Patient sitting comfortably in her chair.  No acute distress noted. Cardio: Regular rate and rhythm.  No murmurs, rubs, gallops. Pulm: Clear to auscultation bilaterally.  Normal work of breathing on room air. Chest: Abdomen: Soft, nontender, nondistended. MSK: Normal bulk and tone Skin: Skin is warm and dry. Neuro: Mental Status: Patient is awake, alert, oriented x3 No  signs of aphasia or neglect Cranial Nerves: II: Pupils equal, round, and reactive to light.   III,IV, VI: EOMI without ptosis or diploplia.  V: Facial sensation is symmetric to light touch and  Temperature. VII: Facial movement is symmetric.  VIII: hearing is intact to voice X: Uvula elevates symmetrically XI: Shoulder shrug is symmetric. XII: tongue is midline without atrophy or fasciculations.  Motor: Equal effort thorughout, at Least 5/5 bilateral UE, 5/5 bilateral lower extremitiy  Sensory: Sensation is grossly intact bilateral UEs & LEs Psych: Appropriate mood and affect.  ED Results / Procedures / Treatments   Labs (all labs ordered are listed, but only abnormal results are displayed) Labs Reviewed  CBC WITH DIFFERENTIAL/PLATELET - Abnormal; Notable for the following components:      Result  Value   MCV 79.1 (*)    MCH 25.8 (*)    All other components within normal limits  BASIC METABOLIC PANEL - Abnormal; Notable for the following components:   CO2 19 (*)    BUN <5 (*)    All other components within normal limits  URINALYSIS, ROUTINE W REFLEX MICROSCOPIC - Abnormal; Notable for the following components:   Specific Gravity, Urine 1.004 (*)    Ketones, ur 20 (*)    All other components within normal limits  I-STAT BETA HCG BLOOD, ED ([facility], [facility], [facility] ONLY)    EKG Sinus tachycardia otherwise normal.  Radiology No results found.  Procedures None  Medications Ordered in ED Medications - No data to display  ED Course  I have reviewed the triage vital signs and the nursing notes.  Pertinent labs & imaging results that were available during my care of the patient were reviewed by me and considered in my medical decision making (see chart for details).    MDM Rules/Calculators/A&P                           Patient presented with reports of dizziness and concern for presyncope earlier in the day.  On physical exam, neuro focus exam was normal and patient reported continued improvement with near resolution of dizziness.  Differential includes hypoglycemia 2/2 fasting, orthostatic hypotension. Fortunately she is hemodynamically stable and without concerning lab findings at this time.    Patient advised to return to ED for further evaluation if she experiences a syncopal episode or if the dizziness is persistent and worsening in severity. Final Clinical Impression(s) / ED Diagnoses Final diagnoses:  Dizziness    Rx / DC Orders ED Discharge Orders     None        Champ Mungo, DO 10/21/20 2320    Milagros Loll, MD 10/22/20 [phone]

## 2020-10-21 NOTE — ED Notes (Signed)
Using interpreter services, Patient verbalizes understanding of discharge instructions. Opportunity for questioning and answers were provided. Armband removed by staff, pt discharged from ED ambulatory.

## 2020-10-21 NOTE — Discharge Instructions (Addendum)
?? ???????????? ????   ED ?? ?????????? ??? ??????? ??????? ???? ?? ??????????, ????? ?????? ???? ????????? ????? ?????????? ???? ???????? ????? ??? ???? ????? ?????? Thapa m?ly??kanak? l?gi ED m? pharkanuh?s yadi tap?'?nl? c?tan?k? h?ni v? punar?vart?, cakkara l?gn? l?m? ?pis??ahar? anubhava garnuhuncha jah?m? tap?'?nl?'? b?h?sa huna sakcha jast? l?gcha.

## 2020-10-21 NOTE — ED Provider Notes (Signed)
Emergency Medicine Provider Triage Evaluation Note  Brooke Hobbs , a 35 y.o. female  was evaluated in triage.  Pt complains of a near syncopal spell this morning.  Patient speaks Nepali and a video interpreter was used.  She reports a history of diabetes.  Patient states that she fasts every morning.  She took a shower this morning and felt a little cold.  When she got to work, she was standing and began to feel very warm and dizzy.  No full syncope.  No associated chest pain or shortness of breath.  No vomiting or diarrhea recently.  Orthostatic per EMS.  Review of Systems  Positive: Weakness, dizziness Negative: Chest pain, shortness of breath  Physical Exam  There were no vitals taken for this visit. Gen:   Awake, no distress   Resp:  Normal effort  MSK:   Moves extremities without difficulty  Other:  Regular rate and rhythm  Medical Decision Making  Medically screening exam initiated at 4:45 PM.  Appropriate orders placed.  Amariana Mirando was informed that the remainder of the evaluation will be completed by another provider, this initial triage assessment does not replace that evaluation, and the importance of remaining in the ED until their evaluation is complete.     Renne Crigler, PA-C 10/21/20 1647    Milagros Loll, MD 10/21/20 [phone]

## 2020-10-21 NOTE — ED Triage Notes (Addendum)
Pt arrived by [facility]. Pt was at work and had near syncopal episode where she was dizzy and weak, was assisted down to the ground. No injury. Per ems, was orthostatic pta SBP went from 120 to 96 when sitting up. Pt reports fasting today, but does so every Monday.  Interpretor used at triage.

## 2020-10-21 NOTE — ED Notes (Signed)
Pt ambulatory to hallway bed.

## 2021-05-09 ENCOUNTER — Ambulatory Visit (INDEPENDENT_AMBULATORY_CARE_PROVIDER_SITE_OTHER): Payer: BC Managed Care – PPO | Admitting: Nurse Practitioner

## 2021-05-09 ENCOUNTER — Encounter: Payer: Self-pay | Admitting: Nurse Practitioner

## 2021-05-09 VITALS — BP 130/90 | HR 107 | Temp 97.1°F | Ht <= 58 in | Wt 128.6 lb

## 2021-05-09 DIAGNOSIS — E663 Overweight: Secondary | ICD-10-CM

## 2021-05-09 DIAGNOSIS — N979 Female infertility, unspecified: Secondary | ICD-10-CM

## 2021-05-09 NOTE — Progress Notes (Signed)
? ? ?Careteam: ?Patient Care Team: ?Lauree Chandler, [name] ? ?PLACE OF SERVICE:  ?[facility]  ?Advanced Directive information ?  ? ?No Known Allergies ? ?Chief Complaint  ?Patient presents with  ? Establish Care  ?  New patient establish care. Medication present/not present at initial appointment.   ? ? ? ?HPI: Patient is a 36 y.o. female  to establish care.  ?Reports she does not have a routine provider.  ?Would like to have a baby and needs to have a referral to a GYN.  ?She has been trying to have a child for about 5 years - previously on clomid, provera and metformin but prescription has ran out and no longer taking anything.  ?Appears she was [facility] but unsure if they are still taking her insurance.  ?She was referred to fertility institute but has not gone to appt. She was wondering if there was anything else she needed to do prior to going to fertility clinic.  ? ?No PMH noted ?She is currently not taking any medication.  ? ? ?Review of Systems:  ?Review of Systems  ?Constitutional:  Negative for chills, fever and weight loss.  ?HENT:  Negative for tinnitus.   ?Respiratory:  Negative for cough, sputum production and shortness of breath.   ?Cardiovascular:  Negative for chest pain, palpitations and leg swelling.  ?Gastrointestinal:  Negative for abdominal pain, constipation, diarrhea and heartburn.  ?Genitourinary:  Negative for dysuria, frequency and urgency.  ?Musculoskeletal:  Negative for back pain, falls, joint pain and myalgias.  ?Skin: Negative.   ?Neurological:  Negative for dizziness and headaches.  ?Psychiatric/Behavioral:  Negative for depression and memory loss. The patient does not have insomnia.   ? ?History reviewed. No pertinent past medical history. ?History reviewed. No pertinent surgical history. ?Social History: ?  reports that she has never smoked. She has quit using smokeless tobacco.  Her smokeless tobacco  use included chew. She reports that she does not drink alcohol and does not use drugs. ? ?History reviewed. No pertinent family history. ? ?Medications: ?Patient's Medications  ?New Prescriptions  ? No medications on file  ?Previous Medications  ? No medications on file  ?Modified Medications  ? No medications on file  ?Discontinued Medications  ? CLOMIPHENE (CLOMID) 50 MG TABLET    Take 1 tablet daily for 5 days starting on day 5 of menstrual cycle  ? CLOMIPHENE (CLOMID) 50 MG TABLET    TAKE 1 TABLET BY MOUTH DAILY STARTING ON DAY 5 OF YOUR MENSTRUAL CYCLE  ? MEDROXYPROGESTERONE (PROVERA) 10 MG TABLET    Take 1 tablet (10 mg total) by mouth daily.  ? METFORMIN (GLUCOPHAGE) 500 MG TABLET    500 mg with dinner x 1 week, increase to 1000 mg x 1 week, increase to 1500 mg with dinner daily  ? ? ?Physical Exam: ? ?Vitals:  ? 05/09/21 1253  ?BP: 130/90  ?Pulse: (!) 107  ?Temp: (!) 97.1 ?F (36.2 ?C)  ?SpO2: 98%  ?Weight: 128 lb 9.6 oz (58.3 kg)  ?Height: 4\' 8"  (1.422 m)  ? ?Body mass index is 28.83 kg/m?. ?Wt Readings from Last 3 Encounters:  ?05/09/21 128 lb 9.6 oz (58.3 kg)  ?08/07/20 134 lb 11.2 oz (61.1 kg)  ?10/12/19 131 lb (59.4 kg)  ? ? ?Physical Exam ?Constitutional:   ?   General: She is not in acute distress. ?   Appearance: She is well-developed. She is not diaphoretic.  ?  HENT:  ?   Head: Normocephalic and atraumatic.  ?   Mouth/Throat:  ?   Pharynx: No oropharyngeal exudate.  ?Eyes:  ?   Conjunctiva/sclera: Conjunctivae normal.  ?   Pupils: Pupils are equal, round, and reactive to light.  ?Cardiovascular:  ?   Rate and Rhythm: Normal rate and regular rhythm.  ?   Heart sounds: Normal heart sounds.  ?Pulmonary:  ?   Effort: Pulmonary effort is normal.  ?   Breath sounds: Normal breath sounds.  ?Abdominal:  ?   General: Bowel sounds are normal.  ?   Palpations: Abdomen is soft.  ?Musculoskeletal:  ?   Cervical back: Normal range of motion and neck supple.  ?   Right lower leg: No edema.  ?   Left lower leg: No  edema.  ?Skin: ?   General: Skin is warm and dry.  ?Neurological:  ?   Mental Status: She is alert.  ?Psychiatric:     ?   Mood and Affect: Mood normal.  ? ? ?Labs reviewed: ?Basic Metabolic Panel: ?Recent Labs  ?  10/21/20 ?1709  ?NA 136  ?K 3.8  ?CL 106  ?CO2 19*  ?GLUCOSE 85  ?BUN <5*  ?CREATININE 0.52  ?CALCIUM 9.1  ? ?Liver Function Tests: ?No results for input(s): AST, ALT, ALKPHOS, BILITOT, PROT, ALBUMIN in the last 8760 hours. ?No results for input(s): LIPASE, AMYLASE in the last 8760 hours. ?No results for input(s): AMMONIA in the last 8760 hours. ?CBC: ?Recent Labs  ?  10/21/20 ?1709  ?WBC 8.3  ?NEUTROABS 5.2  ?HGB 13.1  ?HCT 40.1  ?MCV 79.1*  ?PLT 304  ? ?Lipid Panel: ?No results for input(s): CHOL, HDL, LDLCALC, TRIG, CHOLHDL, LDLDIRECT in the last 8760 hours. ?TSH: ?No results for input(s): TSH in the last 8760 hours. ?A1C: ?No results found for: HGBA1C ? ? ?Assessment/Plan ?1. Infertility, female ?-she has been referred to fertility clinic by GYN but unsure if that would be the next steps. Encouraged her to follow up with fertility clinic for recommendations.  ? ?2. Overweight (BMI 25.0-29.9) ?-noted today, dietary and exercise modifications encouraged, will have her follow up in 3 months for CPE and fasting labs.  ? ? ?Return in about 3 months (around 08/08/2021) for CPE with fasting blood work at visit . ?Carlos American. Dewaine Oats, AGNP ? ?[facility] ?[phone]  ?

## 2021-05-13 ENCOUNTER — Telehealth: Payer: Self-pay | Admitting: Lactation Services

## 2021-05-13 NOTE — Telephone Encounter (Signed)
Patient called and LM on Lactation voicemail. Was not able to understand message. She has not recently had a baby. Will send to Clinical Pool for follow up.  ?

## 2021-05-14 ENCOUNTER — Telehealth: Payer: Self-pay | Admitting: General Practice

## 2021-05-14 ENCOUNTER — Telehealth: Payer: Self-pay | Admitting: Lactation Services

## 2021-05-14 NOTE — Telephone Encounter (Signed)
Patient left message on Lactation voicemail that she needs for someone to call in regards to medication refill.  ?

## 2021-05-14 NOTE — Telephone Encounter (Signed)
Patient called and left message on nurse voicemail line that was inaudible but wanted a callback.  ? ?Called patient with pacific interpreter [phone] stating I am returning her phone call. Patient states she was calling to get her records sent to the fertility specialist. Per front office, records have already been sent this morning. Informed patient. Advised if she doesn't hear anything from the fertility specialist in a few days to call us back. Patient verbalized understanding. ?

## 2021-08-15 ENCOUNTER — Encounter: Payer: Self-pay | Admitting: Nurse Practitioner

## 2021-08-18 ENCOUNTER — Other Ambulatory Visit: Payer: BC Managed Care – PPO

## 2021-08-18 ENCOUNTER — Ambulatory Visit (INDEPENDENT_AMBULATORY_CARE_PROVIDER_SITE_OTHER): Payer: BC Managed Care – PPO | Admitting: Nurse Practitioner

## 2021-08-18 ENCOUNTER — Encounter: Payer: Self-pay | Admitting: Nurse Practitioner

## 2021-08-18 VITALS — BP 118/88 | HR 85 | Temp 97.1°F | Ht <= 58 in | Wt 128.0 lb

## 2021-08-18 DIAGNOSIS — Z0000 Encounter for general adult medical examination without abnormal findings: Secondary | ICD-10-CM | POA: Diagnosis not present

## 2021-08-18 DIAGNOSIS — Z1159 Encounter for screening for other viral diseases: Secondary | ICD-10-CM

## 2021-08-18 DIAGNOSIS — Z114 Encounter for screening for human immunodeficiency virus [HIV]: Secondary | ICD-10-CM | POA: Diagnosis not present

## 2021-08-18 NOTE — Progress Notes (Signed)
Provider: Lauree Chandler, NP  [name], [name]  Extended Emergency Contact Information Primary Emergency Contact: April Holding, [place] [address] [address] of [place] Phone: [phone] Relation: Brother No Known Allergies Code Status: FULL Goals of Care: Advanced Directive information    08/18/2021    7:53 AM  Advanced Directives  Does Patient Have a Medical Advance Directive? No  Would patient like information on creating a medical advance directive? No - Patient declined     Chief Complaint  Patient presents with   Annual Exam    Yearly physical with pap smear. Discuss need for HIV screening, Hep C screening, and additional covid boosters or post pone if patient refuses. NCIR verified     HPI: Patient is a 36 y.o. female seen in today [facility].   She is due for follow up with GYN but does not have number.   Does not have dentist and has not seen routinely  She missed last follow up with ophthalmology, but plans to reschedule.      08/18/2021    8:01 AM 08/07/2020    5:08 PM 10/12/2019   10:15 AM 08/10/2019    4:50 PM  Depression screen PHQ 2/9  Decreased Interest 0 2 0 0  Down, Depressed, Hopeless 0 0 0 0  PHQ - 2 Score 0 2 0 0  Altered sleeping  2 0 0  Tired, decreased energy  _0 Change in appetite  3 0 0  Feeling bad or failure about yourself   0 0 0  Trouble concentrating  0 0 0  Moving slowly or fidgety/restless  0 0 0  Suicidal thoughts  0 0 0  PHQ-9 Score  _1 10/21/2020    3:10 PM 10/21/2020    4:44 PM 08/18/2021    8:00 AM  Fall Risk  Falls in the past year?   0  Was there an injury with Fall?   0  Fall Risk Category Calculator   0  Fall Risk Category   Low  Patient Fall Risk Level Low fall risk Low fall risk Low fall risk  Patient at Risk for Falls Due to   No Fall Risks  Fall risk Follow up   Falls evaluation  completed       No data to display           Health Maintenance  Topic Date Due   HIV Screening  Never done   Hepatitis C Screening  Never done   COVID-19 Vaccine (4 - Pfizer series) 07/18/2019   INFLUENZA VACCINE  08/19/2021   PAP SMEAR-Modifier  08/10/2022   TETANUS/TDAP  10/11/2024   HPV VACCINES  Aged Out    History reviewed. No pertinent past medical history.  History reviewed. No pertinent surgical history.  Social History   Socioeconomic History   Marital status: Married    Spouse name: Not on file   Number of children: Not on file   Years of education: Not on file   Highest education level: Not on file  Occupational History   Not on file  Tobacco Use   Smoking status: Never   Smokeless tobacco: Former    Types: Chew  Vaping Use   Vaping Use: Never used  Substance and Sexual Activity   Alcohol use: No  Drug use: No   Sexual activity: Yes    Birth control/protection: None  Other Topics Concern   Not on file  Social History Narrative   Not on file   Social Determinants of Health   Financial Resource Strain: Not on file  Food Insecurity: No Food Insecurity (08/07/2020)   Hunger Vital Sign    Worried About Running Out of Food in the Last Year: Never true    Ran Out of Food in the Last Year: Never true  Transportation Needs: No Transportation Needs (08/07/2020)   PRAPARE - Hydrologist (Medical): No    Lack of Transportation (Non-Medical): No  Physical Activity: Not on file  Stress: Not on file  Social Connections: Not on file    History reviewed. No pertinent family history.  Review of Systems:  Review of Systems  Constitutional:  Negative for chills, fever and weight loss.  HENT:  Negative for tinnitus.   Respiratory:  Negative for cough, sputum production and shortness of breath.   Cardiovascular:  Negative for chest pain, palpitations and leg swelling.  Gastrointestinal:  Negative for abdominal pain,  constipation, diarrhea and heartburn.  Genitourinary:  Negative for dysuria, frequency and urgency.  Musculoskeletal:  Negative for back pain, falls, joint pain and myalgias.  Skin: Negative.   Neurological:  Negative for dizziness and headaches.  Psychiatric/Behavioral:  Negative for depression and memory loss. The patient does not have insomnia.      Allergies as of 08/18/2021   No Known Allergies      Medication List    as of August 18, 2021  8:22 AM   You have not been prescribed any medications.       Physical Exam: Vitals:   08/18/21 0759  BP: 118/88  Pulse: 85  Temp: (!) 97.1 F (36.2 C)  TempSrc: Temporal  SpO2: 99%  Weight: 128 lb (58.1 kg)  Height: _0  (1.448 m)   Body mass index is 27.7 kg/m. Wt Readings from Last 3 Encounters:  08/18/21 128 lb (58.1 kg)  05/09/21 128 lb 9.6 oz (58.3 kg)  08/07/20 134 lb 11.2 oz (61.1 kg)    Physical Exam Constitutional:      General: She is not in acute distress.    Appearance: She is well-developed. She is not diaphoretic.  HENT:     Head: Normocephalic and atraumatic.     Mouth/Throat:     Pharynx: No oropharyngeal exudate.  Eyes:     Conjunctiva/sclera: Conjunctivae normal.     Pupils: Pupils are equal, round, and reactive to light.  Cardiovascular:     Rate and Rhythm: Normal rate and regular rhythm.     Heart sounds: Normal heart sounds.  Pulmonary:     Effort: Pulmonary effort is normal.     Breath sounds: Normal breath sounds.  Abdominal:     General: Bowel sounds are normal.     Palpations: Abdomen is soft.  Musculoskeletal:     Cervical back: Normal range of motion and neck supple.     Right lower leg: No edema.     Left lower leg: No edema.  Skin:    General: Skin is warm and dry.  Neurological:     Mental Status: She is alert and oriented to person, place, and time. Mental status is at baseline.  Psychiatric:        Mood and Affect: Mood normal.     Labs reviewed: Basic Metabolic  Panel: Recent Labs  10/21/20 1709  NA 136  K 3.8  CL 106  CO2 19*  GLUCOSE 85  BUN <5*  CREATININE 0.52  CALCIUM 9.1   Liver Function Tests: No results for input(s): "AST", "ALT", "ALKPHOS", "BILITOT", "PROT", "ALBUMIN" in the last 8760 hours. No results for input(s): "LIPASE", "AMYLASE" in the last 8760 hours. No results for input(s): "AMMONIA" in the last 8760 hours. CBC: Recent Labs    10/21/20 1709  WBC 8.3  NEUTROABS 5.2  HGB 13.1  HCT 40.1  MCV 79.1*  PLT 304   Lipid Panel: No results for input(s): "CHOL", "HDL", "LDLCALC", "TRIG", "CHOLHDL", "LDLDIRECT" in the last 8760 hours. No results found for: "HGBA1C"  Procedures: No results found.  Assessment/Plan 1. Preventative health care -number given for GYN to follow up, she sees yearly The patient was counseled regarding the appropriate use of alcohol, regular self-examination of the breasts on a monthly basis, prevention of dental and periodontal disease, diet, regular sustained exercise for at least 30 minutes 5 times per week, routine screening interval for mammogram as recommended by the [place] and ACOG starts at 60,  importance of regular PAP smears, tobacco use,  and recommended schedule for GI hemoccult testing, colonoscopy, cholesterol, thyroid and diabetes screening. - CBC with Differential/Platelet - CMP with eGFR([facility]) - Lipid panel  2. Need for hepatitis C screening test - Hepatitis C antibody  3. Encounter for screening for HIV - HIV Antibody (routine testing w rflx)   Next appt: yearly  Brooke Hobbs K. Clyde, [facility] [phone]

## 2021-08-18 NOTE — Patient Instructions (Addendum)
To make yearly appt with GYN- [facility] [phone]   Exercise 30 mins/5 days a week.    To make appt with dentist- can call insurance to see who is covered locally.

## 2021-08-19 ENCOUNTER — Telehealth: Payer: Self-pay | Admitting: General Practice

## 2021-08-19 LAB — CBC WITH DIFFERENTIAL/PLATELET
Absolute Monocytes: 431 cells/uL (ref 200–950)
Basophils Absolute: 50 cells/uL (ref 0–200)
Basophils Relative: 0.9 %
Eosinophils Absolute: 62 cells/uL (ref 15–500)
Eosinophils Relative: 1.1 %
HCT: 38 % (ref 35.0–45.0)
Hemoglobin: 12 g/dL (ref 11.7–15.5)
Lymphs Abs: 2050 cells/uL (ref 850–3900)
MCH: 23.9 pg — ABNORMAL LOW (ref 27.0–33.0)
MCHC: 31.6 g/dL — ABNORMAL LOW (ref 32.0–36.0)
MCV: 75.5 fL — ABNORMAL LOW (ref 80.0–100.0)
MPV: 10.5 fL (ref 7.5–12.5)
Monocytes Relative: 7.7 %
Neutro Abs: 3007 cells/uL (ref 1500–7800)
Neutrophils Relative %: 53.7 %
Platelets: 380 10*3/uL (ref 140–400)
RBC: 5.03 10*6/uL (ref 3.80–5.10)
RDW: 15.8 % — ABNORMAL HIGH (ref 11.0–15.0)
Total Lymphocyte: 36.6 %
WBC: 5.6 10*3/uL (ref 3.8–10.8)

## 2021-08-19 LAB — COMPLETE METABOLIC PANEL WITH GFR
AG Ratio: 1.4 (calc) (ref 1.0–2.5)
ALT: 16 U/L (ref 6–29)
AST: 16 U/L (ref 10–30)
Albumin: 4.5 g/dL (ref 3.6–5.1)
Alkaline phosphatase (APISO): 73 U/L (ref 31–125)
BUN: 8 mg/dL (ref 7–25)
CO2: 25 mmol/L (ref 20–32)
Calcium: 9.5 mg/dL (ref 8.6–10.2)
Chloride: 105 mmol/L (ref 98–110)
Creat: 0.65 mg/dL (ref 0.50–0.97)
Globulin: 3.2 g/dL (calc) (ref 1.9–3.7)
Glucose, Bld: 89 mg/dL (ref 65–99)
Potassium: 4.5 mmol/L (ref 3.5–5.3)
Sodium: 140 mmol/L (ref 135–146)
Total Bilirubin: 0.3 mg/dL (ref 0.2–1.2)
Total Protein: 7.7 g/dL (ref 6.1–8.1)
eGFR: 118 mL/min/{1.73_m2} (ref >=60)

## 2021-08-19 LAB — HEPATITIS C ANTIBODY: Hepatitis C Ab: NONREACTIVE

## 2021-08-19 LAB — LIPID PANEL
Cholesterol: 208 mg/dL — ABNORMAL HIGH (ref <200)
HDL: 49 mg/dL — ABNORMAL LOW (ref >=50)
LDL Cholesterol (Calc): 134 mg/dL (calc) — ABNORMAL HIGH
Non-HDL Cholesterol (Calc): 159 mg/dL (calc) — ABNORMAL HIGH (ref <130)
Total CHOL/HDL Ratio: 4.2 (calc) (ref <5.0)
Triglycerides: 132 mg/dL (ref <150)

## 2021-08-19 LAB — HIV ANTIBODY (ROUTINE TESTING W REFLEX): HIV 1&2 Ab, 4th Generation: NONREACTIVE

## 2021-08-19 NOTE — Telephone Encounter (Signed)
Patient called into front office asking about referral to infertility. Per chart review, records have been sent a couple times to [facility]. Called them to check on status of referral. They did receive records on the patient and tried to call her twice in May with an interpreter but couldn't reach her or leave a voicemail as one was not set up. They stated since her last exam with [place] was a year ago, patient will have to be seen again and a new referral with records sent. Discussed with patient. Patient verbalized understanding & is aware of October appt.

## 2021-09-08 ENCOUNTER — Ambulatory Visit (HOSPITAL_COMMUNITY)
Admission: EM | Admit: 2021-09-08 | Discharge: 2021-09-08 | Disposition: A | Discharge status: Home / Self Care | Payer: BC Managed Care – PPO | Attending: Internal Medicine | Admitting: Internal Medicine

## 2021-09-08 ENCOUNTER — Encounter: Payer: Self-pay | Admitting: Nurse Practitioner

## 2021-09-08 ENCOUNTER — Encounter (HOSPITAL_COMMUNITY): Payer: Self-pay

## 2021-09-08 DIAGNOSIS — J069 Acute upper respiratory infection, unspecified: Secondary | ICD-10-CM | POA: Insufficient documentation

## 2021-09-08 DIAGNOSIS — Z20822 Contact with and (suspected) exposure to covid-19: Secondary | ICD-10-CM | POA: Insufficient documentation

## 2021-09-08 LAB — SARS CORONAVIRUS 2 BY RT PCR: SARS Coronavirus 2 by RT PCR: NEGATIVE

## 2021-09-08 MED ORDER — ACETAMINOPHEN 325 MG PO TABS
650.0000 mg | ORAL_TABLET | Freq: Once | ORAL | Status: AC
Start: 2021-09-08 — End: 2021-09-08
  Administered 2021-09-08: 650 mg via ORAL

## 2021-09-08 MED ORDER — ACETAMINOPHEN 325 MG PO TABS
ORAL_TABLET | ORAL | Status: AC
  Filled 2021-09-08: qty 2

## 2021-09-08 MED ORDER — BENZONATATE 100 MG PO CAPS: 100.0000 mg | ORAL_CAPSULE | Freq: Three times a day (TID) | ORAL | 0 refills | Status: AC | PRN

## 2021-09-08 NOTE — ED Triage Notes (Signed)
Pt c/o cough, runny nose, fever, and headache since Saturday. Denies taking any meds.

## 2021-09-08 NOTE — Discharge Instructions (Addendum)
We will call you if the COVID test returns positive.   Please take tylenol or ibuprofen every 6 hours to control fever and help your symptoms. Increase your water intake as well.  You can try the cough medicine up to 3 times daily as needed.  Please go to the emergency department if symptoms worsen.

## 2021-09-08 NOTE — ED Provider Notes (Signed)
[facility]    CSN: [id number] Arrival date & time: 09/08/21  7425      History   Chief Complaint Chief Complaint  Patient presents with   Cough    HPI Brooke Hobbs is a 36 y.o. female.  Presents with 2 day history of URI symptoms. Reports Saturday morning she developed fever, congestion and runny nose, cough, headache.  Reports cough is somewhat productive of mucus.  She tried a Tylenol Saturday night that brought her fever down and resolved headache but has not taken any medicine since. Able to tolerate p.o fluids Denies sore throat, chest pain, shortness of breath, abdominal pain, nausea, vomiting/diarrhea, rash. Two recent sick contacts, her sister and her husband who both had similar symptoms.  History reviewed. No pertinent past medical history.  There are no problems to display for this patient.  History reviewed. No pertinent surgical history.  OB History   No obstetric history on file.      Home Medications    Prior to Admission medications   Medication Sig Start Date End Date Taking? Authorizing Provider  benzonatate (TESSALON) 100 MG capsule Take 1 capsule (100 mg total) by mouth every 8 (eight) hours as needed for up to 5 days for cough. 09/08/21 09/13/21 Yes Toiya Morrish, [place]    Family History History reviewed. No pertinent family history.  Social History Social History   Tobacco Use   Smoking status: Never   Smokeless tobacco: Never  Substance Use Topics   Alcohol use: Not Currently   Drug use: Not Currently     Allergies   Patient has no known allergies.   Review of Systems Review of Systems  Respiratory:  Positive for cough.    Per HPI  Physical Exam Triage Vital Signs ED Triage Vitals [09/08/21 0841]  Enc Vitals Group     BP 126/70     Pulse Rate (!) 125     Resp 18     Temp (!) 101.3 F (38.5 C)     Temp Source Oral     SpO2 98 %     Weight      Height      Head Circumference      Peak Flow       Pain Score      Pain Loc      Pain Edu?      Excl. in GC?    No data found.  Updated Vital Signs BP 126/70 (BP Location: Left Arm)   Pulse (!) 109   Temp (!) 100.6 F (38.1 C) (Oral)   Resp 18   SpO2 98%    Physical Exam Constitutional:      General: She is not in acute distress.    Appearance: Normal appearance.     Comments: Dry sounding cough intermittently   HENT:     Mouth/Throat:     Mouth: Mucous membranes are moist.     Pharynx: Oropharynx is clear. No posterior oropharyngeal erythema.  Eyes:     Conjunctiva/sclera: Conjunctivae normal.  Cardiovascular:     Rate and Rhythm: Regular rhythm. Tachycardia present.     Pulses: Normal pulses.     Heart sounds: Normal heart sounds.  Pulmonary:     Effort: Pulmonary effort is normal. No respiratory distress.     Breath sounds: Normal breath sounds. No wheezing.  Lymphadenopathy:     Cervical: No cervical adenopathy.  Skin:    General: Skin is warm and dry.  Neurological:  Mental Status: She is alert and oriented to person, place, and time.      UC Treatments / Results  Labs (all labs ordered are listed, but only abnormal results are displayed) Labs Reviewed  SARS CORONAVIRUS 2 BY RT PCR    EKG  Radiology No results found.  Procedures Procedures  Medications Ordered in UC Medications  acetaminophen (TYLENOL) tablet 650 mg (650 mg Oral Given 09/08/21 0848)    Initial Impression / Assessment and Plan / UC Course  I have reviewed the triage vital signs and the nursing notes.  Pertinent labs & imaging results that were available during my care of the patient were reviewed by me and considered in my medical decision making (see chart for details).  Tachycardic and febrile to 101.3 in clinic. Tylenol dose given, HR down from 125 to 109. Temp down to 100.6. Patient reports feeling better with medicine.   COVID swab pending. Likely viral etiology of symptoms. If COVID positive, no antivirals.    Will try symptomatic care at home with tylenol/ibuprofen, cough medicine. Discussed with patient she may need to take medicine consistently for the next few days to help control symptoms.  Sent tessalon to pharmacy  Return precautions discussed. Patient agrees to plan  Final Clinical Impressions(s) / UC Diagnoses   Final diagnoses:  Viral URI with cough     Discharge Instructions      We will call you if the COVID test returns positive.   Please take tylenol or ibuprofen every 6 hours to control fever and help your symptoms. Increase your water intake as well.  You can try the cough medicine up to 3 times daily as needed.  Please go to the emergency department if symptoms worsen.     ED Prescriptions     Medication Sig Dispense Auth. Provider   benzonatate (TESSALON) 100 MG capsule Take 1 capsule (100 mg total) by mouth every 8 (eight) hours as needed for up to 5 days for cough. 15 capsule Akiyah Eppolito, Lurena Joiner, PA-C      PDMP not reviewed this encounter.   Raevyn Sokol, Lurena Joiner, [place] 09/08/21 0945

## 2021-11-03 ENCOUNTER — Ambulatory Visit (INDEPENDENT_AMBULATORY_CARE_PROVIDER_SITE_OTHER): Payer: BC Managed Care – PPO | Admitting: Obstetrics and Gynecology

## 2021-11-03 ENCOUNTER — Encounter: Payer: Self-pay | Admitting: Obstetrics and Gynecology

## 2021-11-03 ENCOUNTER — Encounter: Payer: Self-pay | Admitting: Family Medicine

## 2021-11-03 VITALS — BP 137/88 | HR 95 | Ht <= 58 in | Wt 129.5 lb

## 2021-11-03 DIAGNOSIS — N914 Secondary oligomenorrhea: Secondary | ICD-10-CM | POA: Diagnosis not present

## 2021-11-03 DIAGNOSIS — Z789 Other specified health status: Secondary | ICD-10-CM | POA: Diagnosis not present

## 2021-11-03 DIAGNOSIS — N979 Female infertility, unspecified: Secondary | ICD-10-CM

## 2021-11-03 MED ORDER — METFORMIN HCL 500 MG PO TABS: ORAL_TABLET | ORAL | 6 refills | Status: DC

## 2021-11-03 NOTE — Progress Notes (Signed)
Menses irregular, one in august and one in October    Subjective:    Patient ID: Brooke Hobbs, female    DOB: 09/13/1985, 36 y.o.   MRN: [id number]  HPI Pt seen for follow up.  She does note some regularity of menses while taking metformin;however, she is not taking any metformin because she never called for a refill.  The patient is still concerned about her infertility, but has also missed her infertility appointments.  Review of chart shows her infertility has been an issue for several years.   Review of Systems     Objective:   Physical Exam Vitals:   11/03/21 1325  BP: 137/88  Pulse: 95         Assessment & Plan:   1. Infertility, female Will restart metformin for now Pt advised to follow up with reproductive endocrinology regarding fertility and persistent PCOS  - metFORMIN (GLUCOPHAGE) 500 MG tablet; 1 tab at dinner for one week then increase to two tabs at dinner daily  Dispense: 80 tablet; Refill: 6 - Ambulatory referral to Infertility  2. Language barrier Live interpreter present  3. Secondary oligomenorrhea See above - Ambulatory referral to Infertility  F/u 07/2022 for annual exam/pap  I spent 20 minutes dedicated to the care of this patient including previsit review of records, face to face time with the patient discussing treatment options and post visit testing.   Griffin Basil, MD Faculty Attending, [facility]

## 2022-05-08 ENCOUNTER — Other Ambulatory Visit: Payer: Self-pay

## 2022-05-08 NOTE — Progress Notes (Addendum)
Erroneous encouter

## 2023-03-04 ENCOUNTER — Ambulatory Visit: Payer: Self-pay | Admitting: Physician Assistant

## 2023-03-04 ENCOUNTER — Encounter: Payer: Self-pay | Admitting: Physician Assistant

## 2023-03-04 VITALS — BP 126/90 | HR 96 | Ht <= 58 in | Wt 125.0 lb

## 2023-03-04 DIAGNOSIS — N914 Secondary oligomenorrhea: Secondary | ICD-10-CM | POA: Diagnosis not present

## 2023-03-04 DIAGNOSIS — L309 Dermatitis, unspecified: Secondary | ICD-10-CM

## 2023-03-04 DIAGNOSIS — Z758 Other problems related to medical facilities and other health care: Secondary | ICD-10-CM

## 2023-03-04 DIAGNOSIS — D649 Anemia, unspecified: Secondary | ICD-10-CM | POA: Diagnosis not present

## 2023-03-04 DIAGNOSIS — Z603 Acculturation difficulty: Secondary | ICD-10-CM

## 2023-03-04 DIAGNOSIS — Z7984 Long term (current) use of oral hypoglycemic drugs: Secondary | ICD-10-CM | POA: Diagnosis not present

## 2023-03-04 MED ORDER — TRIAMCINOLONE ACETONIDE 0.1 % EX CREA: 1.0000 | TOPICAL_CREAM | Freq: Two times a day (BID) | CUTANEOUS | 0 refills | Status: DC

## 2023-03-04 NOTE — Progress Notes (Signed)
New Patient Office Visit  Subjective    Patient ID: Brooke Hobbs, female    DOB: 07-02-1985  Age: 38 y.o. MRN: [id number]  CC:  Chief Complaint  Patient presents with   Rash    On hands and neck, patient think it could be related to Hot water    Menstrual Problem    Was treated with BC pills to regulate menses in the past.    Establish Care    Primary care/ GYN   Discussed the use of AI scribe software for clinical note transcription with the patient, who gave verbal consent to proceed.  History of Present Illness         The patient presents with irregular periods, sometimes skipping for two to three months. The last period was in December, skipped January, and resumed in February. She was previously on birth control pills and metformin to help regulate her periods. She also reports a long-standing rash on both hands and neck, which has been present for about three months and tends to occur during the winter season. The rash is described as itchy and burning, especially after washing hands, and sometimes bleeds. She has not tried any treatments for the rash and has not changed any detergents, body washes, or fragrances.  In addition to these concerns, the patient also expresses a desire to see a gynecologist for a comprehensive check-up. She had previously  been taking metformin, which she believes helps with weight loss, but she is unsure of its effectiveness in regulating her periods.     Outpatient Encounter Medications as of 03/04/2023  Medication Sig   triamcinolone cream (KENALOG) 0.1 % Apply 1 Application topically 2 (two) times daily.   metFORMIN (GLUCOPHAGE) 500 MG tablet 1 tab at dinner for one week then increase to two tabs at dinner daily (Patient not taking: Reported on 03/04/2023)   No facility-administered encounter medications on file as of 03/04/2023.    History reviewed. No pertinent past medical history.  Past Surgical History:  Procedure Laterality  Date   EYE SURGERY      History reviewed. No pertinent family history.  Social History   Socioeconomic History   Marital status: Married    Spouse name: Not on file   Number of children: Not on file   Years of education: Not on file   Highest education level: Not on file  Occupational History   Not on file  Tobacco Use   Smoking status: Never    Passive exposure: Current   Smokeless tobacco: Never  Vaping Use   Vaping status: Never Used  Substance and Sexual Activity   Alcohol use: Not Currently   Drug use: Not Currently   Sexual activity: Yes    Birth control/protection: None  Other Topics Concern   Not on file  Social History Narrative   ** Merged History Encounter **       Social Drivers of Health   Financial Resource Strain: Not on file  Food Insecurity: No Food Insecurity (11/03/2021)   Hunger Vital Sign    Worried About Running Out of Food in the Last Year: Never true    Ran Out of Food in the Last Year: Never true  Transportation Needs: No Transportation Needs (11/03/2021)   PRAPARE - Administrator, Civil Service (Medical): No    Lack of Transportation (Non-Medical): No  Physical Activity: Not on file  Stress: Not on file  Social Connections: Not on file  Intimate Partner  Violence: Not on file    Review of Systems  Constitutional: Negative.   HENT: Negative.    Eyes: Negative.   Respiratory:  Negative for shortness of breath.   Cardiovascular:  Negative for chest pain.  Gastrointestinal: Negative.   Genitourinary: Negative.   Musculoskeletal: Negative.   Skin:  Positive for rash. Negative for itching.  Neurological: Negative.   Endo/Heme/Allergies: Negative.   Psychiatric/Behavioral: Negative.          Objective    BP (!) 126/90 (BP Location: Left Arm, Patient Position: Sitting, Cuff Size: Normal)   Pulse 96   Ht 4\' 10"  (1.473 m)   Wt 125 lb (56.7 kg)   LMP 02/21/2023   SpO2 98%   BMI 26.13 kg/m   Physical Exam Vitals  and nursing note reviewed.  Constitutional:      Appearance: Normal appearance.  HENT:     Head: Normocephalic and atraumatic.     Right Ear: External ear normal.     Left Ear: External ear normal.     Nose: Nose normal.     Mouth/Throat:     Mouth: Mucous membranes are moist.     Pharynx: Oropharynx is clear.  Cardiovascular:     Rate and Rhythm: Normal rate and regular rhythm.     Pulses: Normal pulses.     Heart sounds: Normal heart sounds.  Pulmonary:     Effort: Pulmonary effort is normal.     Breath sounds: Normal breath sounds.  Musculoskeletal:        General: Normal range of motion.     Cervical back: Normal range of motion.  Skin:    General: Skin is warm and dry.     Findings: Rash present. Rash is crusting and vesicular.     Comments: Noted on both hands and behind both ears   Neurological:     General: No focal deficit present.     Mental Status: She is alert.  Psychiatric:        Mood and Affect: Mood normal.        Behavior: Behavior normal.        Thought Content: Thought content normal.        Judgment: Judgment normal.         Assessment & Plan:   Problem List Items Addressed This Visit       Other   Oligomenorrhea - Primary   Relevant Orders   Ambulatory referral to Gynecology   Comp. Metabolic Panel (12)   TSH   Language barrier   Other Visit Diagnoses       Dermatitis       Relevant Medications   triamcinolone cream (KENALOG) 0.1 %     Anemia, unspecified type       Relevant Orders   CBC with Differential/Platelet   Iron, TIBC and Ferritin Panel      1. Secondary oligomenorrhea (Primary) Referral started  - Ambulatory referral to Gynecology - Comp. Metabolic Panel (12) - TSH  2. Language barrier   3. Dermatitis Trial kenalog , patient education given on supportive care  - triamcinolone cream (KENALOG) 0.1 %; Apply 1 Application topically 2 (two) times daily.  Dispense: 30 g; Refill: 0  4. Anemia, unspecified type Does  not take iron on a daily basis - CBC with Differential/Platelet - Iron, TIBC and Ferritin Panel   I have reviewed the patient's medical history (PMH, PSH, Social History, Family History, Medications, and allergies) , and have been updated if  relevant. I spent 30 minutes reviewing chart and  face to face time with patient.    Return if symptoms worsen or fail to improve.   Kasandra Knudsen Mayers, PA-C

## 2023-03-04 NOTE — Patient Instructions (Addendum)
VISIT SUMMARY:  During today's visit, we discussed your irregular periods and the long-standing rash on your hands and neck. We also talked about your desire to see a gynecologist for a comprehensive check-up. Additionally, we reviewed your general health maintenance and ordered some routine labs.  YOUR PLAN:  -MENSTRUAL IRREGULARITY: Menstrual irregularity means that your periods are not occurring in a regular pattern. We will refer you to a gynecologist for further evaluation and management. Additionally, we will order labs to check for anemia, which can occur if you have heavy periods.  -DERMATITIS: Dermatitis is a condition that causes a rash, which can be itchy and uncomfortable. We will prescribe a topical cream to help relieve your symptoms. You should also apply lotion after showering to protect your skin.  INSTRUCTIONS:  Please follow up with the gynecologist as referred for your menstrual irregularity. Additionally, make sure to complete the lab tests we have ordered to check for anemia.  Roney Jaffe, PA-C Physician Assistant [facility] [email or web address]  Rash, Adult A rash is a breakout of spots or blotches on the skin. It can affect the way the skin looks and feels. Many things can cause a rash. Common causes include: Viral infections. These include colds, measles, and hand, foot, and mouth disease. Bacterial infections. These include scarlet fever and impetigo. Fungal infections. These include athlete's foot, ringworm, and yeast rashes. Skin irritation. This may be from heat rash, exposure to moisture or friction for a long time (intertrigo), or exposure to soap or skin care products (eczema). Allergic reactions. These may be caused by foods, medicines, or things like poison ivy. Some rashes may go away after a few days. Others may last for a few weeks. The goal of treatment is to stop the itching and keep the rash  from spreading. Follow these instructions at home: Medicine Take or apply over-the-counter and prescription medicines only as told by your health care provider. These may include: Corticosteroids. These can help treat red or swollen skin. They may be given as creams or as medicines to take by mouth (orally). Anti-itch lotions. Allergy medicines. Pain medicine. Antifungal medicine if the rash is from a fungal infection. Antibiotics if you have an infection.  Skin care Apply cool, wet cloths (compresses) to the affected areas. Do not scratch or rub your skin. Avoid covering the rash. Keep it exposed to air as often as you can. Managing itching and discomfort Avoid hot showers and baths. These can make itching worse. A cold shower may help. Try taking a bath with: Epsom salts. You can get these at your local pharmacy or grocery store. Follow the instructions on the package. Baking soda. Pour a small amount into the bath as told by your provider. Colloidal oatmeal. You can get this at your local pharmacy or grocery store. Follow the instructions on the package. Try putting baking soda paste on your skin. Stir water into baking soda until it becomes like a paste. Try using calamine lotion or cortisone cream to help with itchiness. Keep cool. Stay out of the sun. Sweating and being hot can make itching worse. General instructions  Rest as needed. Drink enough fluid to keep your pee (urine) pale yellow. Wear loose-fitting clothes. Avoid scented soaps, detergents, and perfumes. Use gentle soaps, detergents, perfumes, and cosmetics. Avoid the things that cause your rash (triggers). Keep a journal to help keep track of your triggers. Write down: What you eat. What cosmetics you use. What you drink. What you wear. This  includes jewelry. Contact a health care provider if: You sweat at night more than normal. You pee (urinate) more or less than normal, or your pee is a darker color than  normal. Your eyes become sensitive to light. Your skin or the white parts of your eyes turn yellow (jaundice). Your skin tingles or is numb. You get painful blisters in your nose or mouth. Your rash does not go away after a few days, or it gets worse. You are more tired or thirsty than normal. You have new or worse symptoms. These may include: Pain in your abdomen. Fever. Diarrhea or vomiting. Weakness or weight loss. Get help right away if: You get confused. You have a severe headache, a stiff neck, or severe joint pain or stiffness. You become very sleepy or not responsive. You have a seizure. This information is not intended to replace advice given to you by your health care provider. Make sure you discuss any questions you have with your health care provider. Document Revised: 10/24/2021 Document Reviewed: 10/24/2021 Elsevier Patient Education  2024 [employer].

## 2023-03-05 LAB — CBC WITH DIFFERENTIAL/PLATELET
Basophils Absolute: 0.1 10*3/uL (ref 0.0–0.2)
Basos: 1 %
EOS (ABSOLUTE): 0.1 10*3/uL (ref 0.0–0.4)
Eos: 2 %
Hematocrit: 36.7 % (ref 34.0–46.6)
Hemoglobin: 11.6 g/dL (ref 11.1–15.9)
Immature Grans (Abs): 0.1 10*3/uL (ref 0.0–0.1)
Immature Granulocytes: 1 %
Lymphocytes Absolute: 2.2 10*3/uL (ref 0.7–3.1)
Lymphs: 37 %
MCH: 24.4 pg — ABNORMAL LOW (ref 26.6–33.0)
MCHC: 31.6 g/dL (ref 31.5–35.7)
MCV: 77 fL — ABNORMAL LOW (ref 79–97)
Monocytes Absolute: 0.5 10*3/uL (ref 0.1–0.9)
Monocytes: 8 %
Neutrophils Absolute: 3.1 10*3/uL (ref 1.4–7.0)
Neutrophils: 51 %
Platelets: 394 10*3/uL (ref 150–450)
RBC: 4.75 x10E6/uL (ref 3.77–5.28)
RDW: 16.7 % — ABNORMAL HIGH (ref 11.7–15.4)
WBC: 6 10*3/uL (ref 3.4–10.8)

## 2023-03-05 LAB — COMP. METABOLIC PANEL (12)
AST: 21 [IU]/L (ref 0–40)
Albumin: 4.5 g/dL (ref 3.9–4.9)
Alkaline Phosphatase: 79 [IU]/L (ref 44–121)
BUN/Creatinine Ratio: 16 (ref 9–23)
BUN: 9 mg/dL (ref 6–20)
Bilirubin Total: <0.2 mg/dL (ref 0.0–1.2)
Calcium: 9.3 mg/dL (ref 8.7–10.2)
Chloride: 106 mmol/L (ref 96–106)
Creatinine, Ser: 0.58 mg/dL (ref 0.57–1.00)
Globulin, Total: 3 g/dL (ref 1.5–4.5)
Glucose: 88 mg/dL (ref 70–99)
Potassium: 4 mmol/L (ref 3.5–5.2)
Sodium: 141 mmol/L (ref 134–144)
Total Protein: 7.5 g/dL (ref 6.0–8.5)
eGFR: 120 mL/min/{1.73_m2} (ref >59)

## 2023-03-05 LAB — IRON,TIBC AND FERRITIN PANEL
Ferritin: 8 ng/mL — ABNORMAL LOW (ref 15–150)
Iron Saturation: 3 % — CL (ref 15–55)
Iron: 14 ug/dL — ABNORMAL LOW (ref 27–159)
Total Iron Binding Capacity: 449 ug/dL (ref 250–450)
UIBC: 435 ug/dL — ABNORMAL HIGH (ref 131–425)

## 2023-03-05 LAB — TSH: TSH: 1.78 u[IU]/mL (ref 0.450–4.500)

## 2023-03-08 MED ORDER — IRON (FERROUS SULFATE) 325 (65 FE) MG PO TABS: 325.0000 mg | ORAL_TABLET | ORAL | 1 refills | Status: DC

## 2023-03-08 NOTE — Addendum Note (Signed)
Addended by: Roney Jaffe on: 03/08/2023 02:41 PM   Modules accepted: Orders

## 2023-04-01 ENCOUNTER — Encounter: Payer: Self-pay | Admitting: Obstetrics and Gynecology

## 2023-10-06 ENCOUNTER — Other Ambulatory Visit: Payer: Self-pay

## 2023-10-06 ENCOUNTER — Ambulatory Visit: Payer: Self-pay

## 2023-10-06 VITALS — BP 119/86 | HR 98 | Wt 119.2 lb

## 2023-10-06 DIAGNOSIS — O0990 Supervision of high risk pregnancy, unspecified, unspecified trimester: Secondary | ICD-10-CM | POA: Insufficient documentation

## 2023-10-06 DIAGNOSIS — Z3491 Encounter for supervision of normal pregnancy, unspecified, first trimester: Secondary | ICD-10-CM

## 2023-10-06 DIAGNOSIS — Z3490 Encounter for supervision of normal pregnancy, unspecified, unspecified trimester: Secondary | ICD-10-CM | POA: Insufficient documentation

## 2023-10-06 DIAGNOSIS — Z3A11 11 weeks gestation of pregnancy: Secondary | ICD-10-CM

## 2023-10-06 NOTE — Progress Notes (Signed)
 New OB Intake  I connected with Brooke Hobbs  on 10/06/23 at  3:15 PM EDT bin office and verified that I am speaking with the correct person using two identifiers.   I discussed the limitations, risks, security and privacy concerns of performing an evaluation and management service by telephone and the availability of in person appointments. I also discussed with the patient that there may be a patient responsible charge related to this service. The patient expressed understanding and agreed to proceed.  I explained I am completing New OB Intake today. We discussed EDD of 04/21/24  based on LMP of 07/16/23. Pt is G1P0000. I reviewed her allergies, medications and Medical/Surgical/OB history.    Patient Active Problem List   Diagnosis Date Noted   Encounter for supervision of low-risk pregnancy, antepartum 10/06/2023   Infertility, female 08/07/2020   Language barrier 10/12/2019     Concerns addressed today None.  Delivery Plans Plans to deliver at [facility]. Discussed the nature of our practice with multiple providers including residents and students as well as female and female providers. Due to the size of the practice, the delivering provider may not be the same as those providing prenatal care.   Patient is not interested in water birth.  MyChart/Babyscripts MyChart access verified.   Blood Pressure Cuff/Weight Scale Patient is self-pay; explained patient will be given BP cuff at first prenatal appt. Explained after first prenatal appt pt will check weekly and document in Babyscripts. Patient does have weight scale.  Anatomy US  Explained first scheduled US  will be around 19 weeks. Message sent to MFM for scheduling.  Is patient a CenteringPregnancy candidate?  Not a Candidate Not a candidate due to Language barrier    Is patient a Mom+Baby Combined Care candidate?  Declined   If accepted, confirm patient does not intend to move from the area for at least 12 months, then  notify Mom+Baby staff.  Is patient a candidate for Babyscripts Optimization? No, due to language barrier.   First visit review I reviewed new OB appt with patient. Explained pt will be seen by Regino, CNM at first visit 10/12/23 at 02:55pm. Discussed [facility] genetic screening with patient. Pt desires [facility] and [facility].. Routine prenatal labs and genetic testing needed at [facility]. Pt request to have all blood work at [facility]. Urin culture collected today at intake.   Last Pap Diagnosis  Date Value Ref Range Status  08/10/2019   Final   - Negative for intraepithelial lesion or malignancy (NILM)    Waddell LITTIE Burows, RN 10/06/2023  4:41 PM

## 2023-10-06 NOTE — Patient Instructions (Signed)

## 2023-10-11 NOTE — Progress Notes (Unsigned)
 History:   Brooke Hobbs is a 38 y.o. G1P0000 at [redacted]w[redacted]d by LMP being seen today for her first obstetrical visit.  Her obstetrical history is significant for primigravida. Patient does intend to breast feed, and supplement with formula. Pregnancy history fully reviewed.  Patient reports fatigue and nausea.      HISTORY: OB History  Gravida Para Term Preterm AB Living  1 0 0 0 0 0  SAB IAB Ectopic Multiple Live Births  0 0 0 0 0    # Outcome Date GA Lbr Len/2nd Weight Sex Type Anes PTL Lv  1 Current             Pap UTD.   Past Medical History:  Diagnosis Date   Medical history non-contributory    Oligomenorrhea 02/28/2015   Past Surgical History:  Procedure Laterality Date   EYE SURGERY     as a Designer, multimedia in [place]   Family History  Problem Relation Age of Onset   Healthy Mother    Healthy Father    Social History   Tobacco Use   Smoking status: Never    Passive exposure: Current   Smokeless tobacco: Former    Types: Snuff, Chew    Quit date: 2020  Vaping Use   Vaping status: Never Used  Substance Use Topics   Alcohol use: Not Currently   Drug use: Not Currently   No Known Allergies Current Outpatient Medications on File Prior to Visit  Medication Sig Dispense Refill   Prenatal w/o A Vit-Fe Fum-FA (PRENATA PO) Take 1 tablet by mouth daily.     Iron , Ferrous Sulfate , 325 (65 Fe) MG TABS Take 325 mg by mouth every other day. (Patient not taking: Reported on 10/06/2023) 30 tablet 1   metFORMIN  (GLUCOPHAGE ) 500 MG tablet 1 tab at dinner for one week then increase to two tabs at dinner daily (Patient not taking: Reported on 10/06/2023) 80 tablet 6   triamcinolone  cream (KENALOG ) 0.1 % Apply 1 Application topically 2 (two) times daily. (Patient not taking: Reported on 10/06/2023) 30 g 0   No current facility-administered medications on file prior to visit.    Review of Systems Pertinent items noted in HPI and remainder of comprehensive ROS otherwise  negative. Physical Exam:   Vitals:   10/12/23 1531  BP: 113/86  Pulse: (!) 107  Weight: 121 lb 1.9 oz (54.9 kg)   Fetal Heart Rate (bpm): 161  Constitutional: Well-developed, well-nourished pregnant female in no acute distress.  HEENT: PERRLA Skin: normal color and turgor, no rash Cardiovascular: normal rate & rhythm, warm and well perfused Respiratory: normal effort, no problems with respiration noted GI: Abd soft, non-distended MS: Extremities nontender, no edema, normal ROM Neurologic: Alert and oriented x 4.  GU: no CVA tenderness Pelvic: xam deferred  Assessment:    Pregnancy: G1P0000 Patient Active Problem List   Diagnosis Date Noted   Supervision of high risk pregnancy, antepartum 10/06/2023   Infertility, female 08/07/2020   Language barrier 10/12/2019     Plan:    1. Encounter for supervision of low-risk pregnancy, antepartum (Primary) - Doing well. - Counseled on medications for nausea and habits for fatigue.   2. [redacted] weeks gestation of pregnancy - Routine PNC, anticipatory guidance.   3. Language barrier - Nepali interpreter used for entire episode of care.    - Initial labs drawn. - Continue prenatal vitamins. - Problem list reviewed and updated. - Genetic Screening discussed, First trimester screen, Quad screen, and NIPS:  requested. - Ultrasound discussed; fetal anatomic survey: requested. - Anticipatory guidance about prenatal visits given including labs, ultrasounds, and testing. - Discussed usage of Babyscripts and virtual visits as additional source of managing and completing prenatal visits in midst of coronavirus and pandemic.   - Encouraged to complete MyChart Registration for her ability to review results, send requests, and have questions addressed.  - The nature of [facility] Healthcare/Faculty Practice with multiple MDs and Advanced Practice Providers was explained to patient; also emphasized that residents, students  are part of our team. - Routine obstetric precautions reviewed. Encouraged to seek out care at office or emergency room [facility] preferred) for urgent and/or emergent concerns.  Return in about 4 weeks (around 11/09/2023) for LOB.    Future Appointments  Date Time Provider Department Center  12/01/2023  7:00 AM [facility] PROVIDER 1 [facility]  12/01/2023  7:30 AM [facility] US2 [facility]    Camie Rote, MSN, CNM, RNC-OB Certified Nurse Midwife, [facility] 10/12/2023 4:05 PM

## 2023-10-12 ENCOUNTER — Encounter: Payer: Self-pay | Admitting: Certified Nurse Midwife

## 2023-10-12 ENCOUNTER — Other Ambulatory Visit: Payer: Self-pay

## 2023-10-12 ENCOUNTER — Other Ambulatory Visit (HOSPITAL_COMMUNITY)
Admission: RE | Admit: 2023-10-12 | Discharge: 2023-10-12 | Disposition: A | Discharge status: Home / Self Care | Payer: Self-pay | Source: Ambulatory Visit | Attending: Certified Nurse Midwife | Admitting: Certified Nurse Midwife

## 2023-10-12 ENCOUNTER — Encounter: Payer: Self-pay | Admitting: Family Medicine

## 2023-10-12 ENCOUNTER — Ambulatory Visit (INDEPENDENT_AMBULATORY_CARE_PROVIDER_SITE_OTHER): Payer: Self-pay | Admitting: Certified Nurse Midwife

## 2023-10-12 VITALS — BP 113/86 | HR 107 | Wt 121.1 lb

## 2023-10-12 DIAGNOSIS — Z23 Encounter for immunization: Secondary | ICD-10-CM

## 2023-10-12 DIAGNOSIS — Z3A12 12 weeks gestation of pregnancy: Secondary | ICD-10-CM

## 2023-10-12 DIAGNOSIS — O0990 Supervision of high risk pregnancy, unspecified, unspecified trimester: Secondary | ICD-10-CM

## 2023-10-12 DIAGNOSIS — Z603 Acculturation difficulty: Secondary | ICD-10-CM

## 2023-10-12 DIAGNOSIS — O0991 Supervision of high risk pregnancy, unspecified, first trimester: Secondary | ICD-10-CM

## 2023-10-12 DIAGNOSIS — Z758 Other problems related to medical facilities and other health care: Secondary | ICD-10-CM

## 2023-10-12 DIAGNOSIS — Z3490 Encounter for supervision of normal pregnancy, unspecified, unspecified trimester: Secondary | ICD-10-CM

## 2023-10-12 NOTE — Patient Instructions (Addendum)
 Safe Medications in Pregnancy   Acne:  Benzoyl Peroxide  Salicylic Acid   Backache/Headache:  Tylenol: 2 regular strength every 4 hours OR               2 Extra strength every 6 hours   Colds/Coughs/Allergies:  Benadryl (alcohol free) 25 mg every 6 hours as needed  Breath right strips  Claritin  Cepacol throat lozenges  Chloraseptic throat spray  Cold-Eeze- up to three times per day  Cough drops, alcohol free  Flonase (by prescription only)  Guaifenesin  Mucinex  Robitussin DM (plain only, alcohol free)  Saline nasal spray/drops  Sudafed (pseudoephedrine) & Actifed * use only after [redacted] weeks gestation and if you do not have high blood pressure  Tylenol  Vicks Vaporub  Zinc lozenges  Zyrtec   Constipation:  Colace  Ducolax suppositories  Fleet enema  Glycerin suppositories  Metamucil  Milk of magnesia  Miralax  Senokot  Smooth move tea   Diarrhea:  Kaopectate  Imodium A-D   *NO pepto Bismol   Hemorrhoids:  Anusol  Anusol HC  Preparation H  Tucks   Indigestion:  Tums  Maalox  Mylanta  Zantac  Pepcid   Insomnia:  Benadryl (alcohol free) 25mg  every 6 hours as needed  Tylenol PM  Unisom, no Gelcaps   Leg Cramps:  Tums  MagGel   Nausea/Vomiting:  Bonine  Dramamine  Emetrol  Ginger extract  Sea bands  Meclizine  Nausea medication to take during pregnancy:  Unisom (doxylamine succinate 25 mg tablets) Take one tablet daily at bedtime. If symptoms are not adequately controlled, the dose can be increased to a maximum recommended dose of two tablets daily (1/2 tablet in the morning, 1/2 tablet mid-afternoon and one at bedtime).  Vitamin B6 100mg  tablets. Take one tablet twice a day (up to 200 mg per day).   Skin Rashes:  Aveeno products  Benadryl cream or 25mg  every 6 hours as needed  Calamine Lotion  1% cortisone cream   Yeast infection:  Gyne-lotrimin 7  Monistat 7    **If taking multiple medications, please check labels to avoid  duplicating the same active ingredients  **take medication as directed on the label  ** Do not exceed 4000 mg of tylenol in 24 hours  **Do not take medications that contain aspirin or ibuprofen            We highly recommend childbirth education to help you plan for labor and begin practicing coping skills (which will be needed with or without pain meds).  [place] Childbirth Education Options: Sign up by visiting [email or web address]  Childbirth ~ Self-Paced eClass (English and Spanish) This online class offers you the freedom to complete a childbirth education series in the comfort of your own home at your own pace.  Childbirth Class (In-Person 4-Week Series  or on Saturdays, Virtual 4-Week Series ~ [place]) This interactive in-person class series will help you and your partner prepare for your birth experience. Topics include: Labor & Birth, Comfort Measures, Breathing Techniques, Massage, Medical Interventions, Pain Management Options, Cesarean Birth, Postpartum Care, and Newborn Care  Comfort Techniques for Labor ~ In-Person Class [facility]) This interactive class is designed for parents-to-be who want to learn & practice hands-on skills to help relieve some of the discomfort of labor and encourage their babies to rotate toward the best position for birth. Moms and their partners will be able to try a variety of labor positions with birth balls and rebozos as well as Financial risk analyst  breathing, relaxation, and visualization techniques.  Natural Childbirth Class (In-Person 5-Week Series, In-Person on Saturdays or Virtual 5-Week Series ~ [place]) This class series is designed for expectant parents who want to learn and practice natural methods of coping with the process of labor and childbirth.  Cesarean Birth Self-Paced eClass (English and Spanish) This online course provides comprehensive information you can trust as you prepare for a possible cesarean birth. In this class, you'll  learn how to make your birth and recovery comfortable and joyful through instructive video clips, animations, and activities.  Waterbirth ~ Airline pilot Interested in a waterbirth? In addition to a consultation with your credentialed waterbirth provider, this free, informational online class will help you discover whether waterbirth is the right fit for you. Not all obstetrical practices offer waterbirth, so check with your healthcare provider.  Tour Probation officer) - [facility] [employer] our 4 minute video tour of [facility] located in [place].   [place] Parenting Education Options:  Pregnancy 101 (Virtual) Congratulations on your pregnancy! This class is geared toward moms in their first trimester, but everyone is welcome. We are excited to guide you through all aspects of supporting a healthy pregnancy. You will learn what to expect at routine prenatal care appointments, common postpartum adjustments, basic infant safety, and breastfeeding.  Successful Partnering & Parenting ~ In-Person Workshop [facility]) This workshop inspires and equips partners of all economic levels, ages, and cultures to confidently care for their infants, support the birthing persons, and navigate their own transformations into new partners and parents. Learning activities are geared towards supporting partner, but moms are welcome to attend.  'Baby & Me' Parenting Group (Virtual on Wednesdays at 11am) Enjoy this time discussing newborn & infant parenting topics and family adjustment issues with other new parents in a relaxed environment. Each week brings a new speaker or baby-centered activity. This group offers support and connection to parents as they journey through the adjustments and struggles of that sometimes overwhelming first year after the birth of a child.  Baby Safety, CPR, & Choking Class ~ Virtual This life-saving information is meant to  encourage parents as they learn important safety and prevention tips as well as infant CPR and relief of choking.  Breastfeeding Class (In-Person in [place] or [employer]) Families learn what to expect in the first days and weeks of breastfeeding your newborn.  Breastfeeding Self-Paced eClass (English & Spanish) Families learn what to expect in the first days and weeks of breastfeeding your newborn.  Caring for Baby ~ In-Person, Virtual or Self-Paced Class This in-person class is for both expectant and adoptive parents who want to learn and practice the most up-to-date newborn care for their babies. Focus is on birth through the first six weeks of life.  CPR & Choking Relief for Infants & Children ~ In-Person Class [facility]) This in-person course is designed for any parent, expectant parent, or adult who cares for infants or children. Participants learn and demonstrate cardiopulmonary resuscitation and choking relief procedures for both infants and children.  Grandparent Love ~ In-Person Class Grandparents will learn the most updated infant care and safety recommendations. They will discover ways to support their own children during the transition into the parenting role and receive tips on communicating with the new parents.   Parenting Support Group Options:  Bereavement Grief Support Group (Pregnancy/Infant Loss) - Virtual This is an ongoing experience that meets once a month and is designed to help you honor  the past, assist you in discovering tools to strengthen you today, and aid you in developing hope for the future.  Breastfeeding & Pumping Support Group (In-Person on Thursdays at 12pm or Virtual on Tuesdays at 5pm) Join us  in-person each Thursday starting June 1st, 2023 at 12pm! This support group is free for all families looking for breastfeeding and/or pumping support.   Community-Based Childbirth Education Options:  [facility] Department Classes:   Childbirth education classes can help you get ready for a positive parenting experience. You can also meet other expectant parents and get free stuff for your baby. Each class runs for five weeks on the same night and costs $45 for the mother-to-be and her support person. Medicaid covers the cost if you are eligible. Call [phone] to register.  [facility] offers a variety of programs for the [employer] and is another great way to get connected. Please go to [email or web address] for more information.  Childbirth With A Twist! Be informed of your options, get educated on birth, understand what your body is doing, learn how to cope, and have a lot of fun and laughs all while doing it either from the comfort of your couch OR in our cozy office and classroom space near the [place] airport. If you are taking a virtual class, then class is taught LIVE, so you can ask questions and receive answers in real-time from an experienced doula and childbirth educator.  This virtual childbirth education class will meet for five instruction times online.  Although we are based in [place], [place], this virtual class is open to anyone in the world. Please visit: [email or web address] for more information.  Books We Love: The Doula Guide to Childbirth by Retia Cook and Vernell Donald The First-Time Parent's Childbirth Handbook by Dr. Corean Glatter, CNM The Birth Partner by Santana [employer]

## 2023-10-13 LAB — CBC/D/PLT+RPR+RH+ABO+RUBIGG...
Antibody Screen: NEGATIVE
Basophils Absolute: 0.1 x10E3/uL (ref 0.0–0.2)
Basos: 1 %
EOS (ABSOLUTE): 0.1 x10E3/uL (ref 0.0–0.4)
Eos: 1 %
HCV Ab: NONREACTIVE
HIV Screen 4th Generation wRfx: NONREACTIVE
Hematocrit: 35.4 % (ref 34.0–46.6)
Hemoglobin: 11.1 g/dL (ref 11.1–15.9)
Hepatitis B Surface Ag: NEGATIVE
Immature Grans (Abs): 0 x10E3/uL (ref 0.0–0.1)
Immature Granulocytes: 0 %
Lymphocytes Absolute: 2 x10E3/uL (ref 0.7–3.1)
Lymphs: 25 %
MCH: 25.2 pg — ABNORMAL LOW (ref 26.6–33.0)
MCHC: 31.4 g/dL — ABNORMAL LOW (ref 31.5–35.7)
MCV: 80 fL (ref 79–97)
Monocytes Absolute: 0.8 x10E3/uL (ref 0.1–0.9)
Monocytes: 11 %
Neutrophils Absolute: 4.9 x10E3/uL (ref 1.4–7.0)
Neutrophils: 62 %
Platelets: 308 x10E3/uL (ref 150–450)
RBC: 4.41 x10E6/uL (ref 3.77–5.28)
RDW: 16.8 % — ABNORMAL HIGH (ref 11.7–15.4)
RPR Ser Ql: NONREACTIVE
Rh Factor: POSITIVE
Rubella Antibodies, IGG: 5.27 {index} (ref >0.99)
WBC: 7.9 x10E3/uL (ref 3.4–10.8)

## 2023-10-13 LAB — GC/CHLAMYDIA PROBE AMP (~~LOC~~) NOT AT ARMC
Chlamydia: NEGATIVE
Comment: NEGATIVE
Comment: NORMAL
Neisseria Gonorrhea: NEGATIVE

## 2023-10-13 LAB — HEMOGLOBIN A1C
Est. average glucose Bld gHb Est-mCnc: 108 mg/dL
Hgb A1c MFr Bld: 5.4 % (ref 4.8–5.6)

## 2023-10-13 LAB — HCV INTERPRETATION

## 2023-10-17 LAB — URINE CULTURE, OB REFLEX

## 2023-10-17 LAB — CULTURE, OB URINE

## 2023-10-18 ENCOUNTER — Ambulatory Visit: Payer: Self-pay | Admitting: Certified Nurse Midwife

## 2023-10-18 DIAGNOSIS — O2341 Unspecified infection of urinary tract in pregnancy, first trimester: Secondary | ICD-10-CM

## 2023-10-18 MED ORDER — CEPHALEXIN 500 MG PO CAPS: 500.0000 mg | ORAL_CAPSULE | Freq: Three times a day (TID) | ORAL | 0 refills | Status: DC

## 2023-10-19 LAB — PANORAMA PRENATAL TEST FULL PANEL:PANORAMA TEST PLUS 5 ADDITIONAL MICRODELETIONS: FETAL FRACTION: 11.1

## 2023-10-20 NOTE — Telephone Encounter (Signed)
 RN attempted to contact pt to inform her of results and medication sent to her pharmacy. [employer] [phone] left voicemail with office callback number.    [place], RN

## 2023-10-20 NOTE — Telephone Encounter (Signed)
-----   Message from Camie DELENA Rote sent at 10/19/2023  8:20 AM EDT ----- Patient with urine culture positive for asymptomatic e.coli UTI susceptible to Keflex.   Please inform patient of diagnosis and treatment sent with aid of interpreter.   1. Urinary tract infection in mother during first trimester of pregnancy (Primary) - cephALEXin (KEFLEX) 500 MG capsule; Take 1 capsule (500 mg total) by mouth 3 (three) times daily.  Dispense: 21 capsule; Refill: 0   Camie Rote, MSN, CNM, RNC-OB Certified Nurse Midwife, [facility] 10/19/2023 8:20 AM  ----- Message ----- From: Rebecka Memos Lab Results In Sent: 10/13/2023   5:38 AM EDT To: Camie DELENA Rote, CNM

## 2023-10-21 ENCOUNTER — Encounter: Payer: Self-pay | Admitting: *Deleted

## 2023-10-21 LAB — HORIZON CUSTOM: REPORT SUMMARY: NEGATIVE

## 2023-10-21 NOTE — Telephone Encounter (Signed)
 I called patient with [employer] # [phone] and left a message we are calling with results and to call our office back. Since we have not reached patient per protocol I will send a letter. Rock Skip PEAK

## 2023-11-07 NOTE — Progress Notes (Unsigned)
   PRENATAL VISIT NOTE  Subjective:  Brooke Hobbs is a 38 y.o. G1P0000 at [redacted]w[redacted]d being seen today for ongoing prenatal care.  She is currently monitored for the following issues for this low-risk pregnancy and has Language barrier; Infertility, female; and Supervision of high risk pregnancy, antepartum on their problem list.  Patient reports {sx:14538}.   .  .   . Denies leaking of fluid.   The following portions of the patient's history were reviewed and updated as appropriate: allergies, current medications, past family history, past medical history, past social history, past surgical history and problem list.   Objective:    There were no vitals filed for this visit.  Fetal Status:           General: Alert, oriented and cooperative. Patient is in no acute distress.  Skin: Skin is warm and dry. No rash noted.   Cardiovascular: Normal heart rate noted  Respiratory: Normal respiratory effort, no problems with respiration noted  Abdomen: Soft, gravid, appropriate for gestational age.        Pelvic: Cervical exam deferred        Extremities: Normal range of motion.     Mental Status: Normal mood and affect. Normal behavior. Normal judgment and thought content.   Assessment and Plan:  Pregnancy: G1P0000 at [redacted]w[redacted]d 1. Supervision of high risk pregnancy, antepartum (Primary) ***  2. Language barrier Due to language barrier, {in person, virtual:32750} interpreter was present during the history-taking, physical exam, and subsequent discussion with this patient.    3. [redacted] weeks gestation of pregnancy ***  Preterm labor symptoms and general obstetric precautions including but not limited to vaginal bleeding, contractions, leaking of fluid and fetal movement were reviewed in detail with the patient. Please refer to After Visit Summary for other counseling recommendations.   No follow-ups on file.  Future Appointments  Date Time Provider Department Center  11/08/2023  9:15 AM  Wallace Joesph DELENA DORISTINE [facility]  12/01/2023  2:00 PM [facility] PROVIDER 1 [facility]  12/01/2023  2:30 PM [facility] US4 [facility]    Joesph DELENA Wallace, PA

## 2023-11-08 ENCOUNTER — Encounter: Payer: Self-pay | Admitting: Family Medicine

## 2023-11-08 ENCOUNTER — Ambulatory Visit (INDEPENDENT_AMBULATORY_CARE_PROVIDER_SITE_OTHER): Payer: Self-pay | Admitting: Family Medicine

## 2023-11-08 VITALS — BP 107/69 | HR 78 | Wt 119.5 lb

## 2023-11-08 DIAGNOSIS — O0990 Supervision of high risk pregnancy, unspecified, unspecified trimester: Secondary | ICD-10-CM

## 2023-11-08 DIAGNOSIS — Z603 Acculturation difficulty: Secondary | ICD-10-CM | POA: Diagnosis not present

## 2023-11-08 DIAGNOSIS — L309 Dermatitis, unspecified: Secondary | ICD-10-CM | POA: Diagnosis not present

## 2023-11-08 DIAGNOSIS — Z3A16 16 weeks gestation of pregnancy: Secondary | ICD-10-CM

## 2023-11-08 DIAGNOSIS — O0992 Supervision of high risk pregnancy, unspecified, second trimester: Secondary | ICD-10-CM | POA: Diagnosis not present

## 2023-11-08 DIAGNOSIS — Z758 Other problems related to medical facilities and other health care: Secondary | ICD-10-CM

## 2023-11-08 MED ORDER — TRIAMCINOLONE ACETONIDE 0.1 % EX CREA: 1.0000 | TOPICAL_CREAM | Freq: Two times a day (BID) | CUTANEOUS | 0 refills | Status: DC

## 2023-11-08 NOTE — Progress Notes (Signed)
 Pt reports rash on her forearms

## 2023-11-10 ENCOUNTER — Ambulatory Visit: Payer: Self-pay | Admitting: Family Medicine

## 2023-11-10 DIAGNOSIS — O0990 Supervision of high risk pregnancy, unspecified, unspecified trimester: Secondary | ICD-10-CM

## 2023-11-10 LAB — AFP, SERUM, OPEN SPINA BIFIDA
AFP MoM: 1.02
AFP Value: 38.8 ng/mL
Gest. Age on Collection Date: 16 wk
Maternal Age At EDD: 37.2 a
OSBR Risk 1 IN: 10000
Test Results:: NEGATIVE
Weight: 120 [lb_av]

## 2023-11-18 DIAGNOSIS — O09519 Supervision of elderly primigravida, unspecified trimester: Secondary | ICD-10-CM | POA: Insufficient documentation

## 2023-12-01 ENCOUNTER — Ambulatory Visit: Payer: Self-pay | Attending: Obstetrics and Gynecology | Admitting: Obstetrics and Gynecology

## 2023-12-01 ENCOUNTER — Ambulatory Visit (HOSPITAL_BASED_OUTPATIENT_CLINIC_OR_DEPARTMENT_OTHER): Payer: Self-pay

## 2023-12-01 ENCOUNTER — Encounter: Payer: Self-pay | Admitting: Obstetrics and Gynecology

## 2023-12-01 ENCOUNTER — Other Ambulatory Visit: Payer: Self-pay

## 2023-12-01 ENCOUNTER — Other Ambulatory Visit: Payer: Self-pay | Admitting: *Deleted

## 2023-12-01 ENCOUNTER — Ambulatory Visit: Payer: Self-pay

## 2023-12-01 VITALS — BP 115/67 | HR 106

## 2023-12-01 DIAGNOSIS — O09512 Supervision of elderly primigravida, second trimester: Secondary | ICD-10-CM | POA: Insufficient documentation

## 2023-12-01 DIAGNOSIS — Z363 Encounter for antenatal screening for malformations: Secondary | ICD-10-CM | POA: Diagnosis not present

## 2023-12-01 DIAGNOSIS — O09522 Supervision of elderly multigravida, second trimester: Secondary | ICD-10-CM

## 2023-12-01 DIAGNOSIS — Z3490 Encounter for supervision of normal pregnancy, unspecified, unspecified trimester: Secondary | ICD-10-CM

## 2023-12-01 DIAGNOSIS — O0990 Supervision of high risk pregnancy, unspecified, unspecified trimester: Secondary | ICD-10-CM | POA: Diagnosis not present

## 2023-12-01 DIAGNOSIS — Z3A19 19 weeks gestation of pregnancy: Secondary | ICD-10-CM | POA: Insufficient documentation

## 2023-12-01 DIAGNOSIS — O321XX0 Maternal care for breech presentation, not applicable or unspecified: Secondary | ICD-10-CM | POA: Insufficient documentation

## 2023-12-01 NOTE — Progress Notes (Signed)
 Maternal-Fetal Medicine Consultation  Name: Brooke Hobbs  MRN: [id number]  GA: G1P0000 [redacted]w[redacted]d   Patient is here for fetal anatomy scan. On cell-free fetal DNA screening, the risks of aneuploidies are not increased. MSAFP screening showed low risk for open-neural tube defects. Patient reports no chronic medical conditions.    Ultrasound Fetal biometry is consistent with her previously-established dates. Normal amniotic fluid.  No makers of aneuploidies or fetal structural defects are seen.  Patient understands the limitations of ultrasound in detecting fetal anomalies.  I counseled the couple with help of Nepali language interpreter present in the room.  Advanced maternal age I informed that AMA is associated with an increase in fetal aneuploidies.  I discussed the significance and limitations of cell-free fetal DNA screening that has a greater detection rate for Down syndrome.  However, it does not detect all chromosomal anomalies. I explained that only amniocentesis will give a definitive result on the fetal karyotype and some genetic conditions (microarray).  Amniocentesis is associated with a small risk of miscarriage (1 and 500 procedures). Patient opted not to have amniocentesis.  Recommendations - Fetal growth assessment at [redacted] weeks gestation.     Consultation including face-to-face (more than 50%) counseling 30 minutes.

## 2023-12-06 ENCOUNTER — Encounter: Payer: Self-pay | Admitting: Obstetrics and Gynecology

## 2023-12-06 ENCOUNTER — Ambulatory Visit (INDEPENDENT_AMBULATORY_CARE_PROVIDER_SITE_OTHER): Payer: Self-pay | Admitting: Obstetrics and Gynecology

## 2023-12-06 VITALS — BP 99/69 | HR 89 | Wt 123.0 lb

## 2023-12-06 DIAGNOSIS — O09512 Supervision of elderly primigravida, second trimester: Secondary | ICD-10-CM

## 2023-12-06 DIAGNOSIS — O0990 Supervision of high risk pregnancy, unspecified, unspecified trimester: Secondary | ICD-10-CM

## 2023-12-06 DIAGNOSIS — Z3A20 20 weeks gestation of pregnancy: Secondary | ICD-10-CM | POA: Diagnosis not present

## 2023-12-06 DIAGNOSIS — O0992 Supervision of high risk pregnancy, unspecified, second trimester: Secondary | ICD-10-CM

## 2023-12-06 DIAGNOSIS — N390 Urinary tract infection, site not specified: Secondary | ICD-10-CM

## 2023-12-06 DIAGNOSIS — Z603 Acculturation difficulty: Secondary | ICD-10-CM

## 2023-12-06 DIAGNOSIS — Z758 Other problems related to medical facilities and other health care: Secondary | ICD-10-CM

## 2023-12-06 NOTE — Patient Instructions (Signed)

## 2023-12-06 NOTE — Progress Notes (Signed)
 PRENATAL VISIT NOTE  Subjective:  Brooke Hobbs is a 38 y.o. G1P0000 at [redacted]w[redacted]d being seen today for ongoing prenatal care.  She is currently monitored for the following issues for this low-risk pregnancy and has Language barrier; Infertility, female; Supervision of high risk pregnancy, antepartum; and AMA (advanced maternal age) primigravida 35+ on their problem list.  Patient reports no complaints.  Contractions: Not present. Vag. Bleeding: None.  Movement: Present. Denies leaking of fluid.   The following portions of the patient's history were reviewed and updated as appropriate: allergies, current medications, past family history, past medical history, past social history, past surgical history and problem list.   Objective:   Vitals:   12/06/23 1042  BP: 99/69  Pulse: 89  Weight: 123 lb (55.8 kg)    Fetal Status:  Fetal Heart Rate (bpm): 144   Movement: Present    General: Alert, oriented and cooperative. Patient is in no acute distress.  Skin: Skin is warm and dry. No rash noted.   Cardiovascular: Normal heart rate noted  Respiratory: Normal respiratory effort, no problems with respiration noted  Abdomen: Soft, gravid, appropriate for gestational age.  Pain/Pressure: Absent     Pelvic: Cervical exam deferred        Extremities: Normal range of motion.  Edema: None  Mental Status: Normal mood and affect. Normal behavior. Normal judgment and thought content.      10/12/2023    3:43 PM 11/03/2021    2:54 PM 08/18/2021    8:01 AM  Depression screen PHQ 2/9  Decreased Interest 0 1 0  Down, Depressed, Hopeless 0 3 0  PHQ - 2 Score 0 4 0  Altered sleeping 0 3   Tired, decreased energy 0 3   Change in appetite 0 0   Feeling bad or failure about yourself  0 3   Trouble concentrating 0 0   Moving slowly or fidgety/restless 0 0   Suicidal thoughts 0 0   PHQ-9 Score 0  13       Data saved with a previous flowsheet row definition        10/12/2023    3:44 PM  11/03/2021    2:54 PM 08/07/2020    5:08 PM 10/12/2019   10:15 AM  GAD 7 : Generalized Anxiety Score  Nervous, Anxious, on Edge 0 3 2 1   Control/stop worrying 0 1 0 0  Worry too much - different things 0 2 0 0  Trouble relaxing 0 2 0 2  Restless 0 1 0 0  Easily annoyed or irritable 0 1 2 0  Afraid - awful might happen 0 2 1 0  Total GAD 7 Score 0 12 5 3     Assessment and Plan:  Pregnancy: G1P0000 at [redacted]w[redacted]d  1. Supervision of high risk pregnancy, antepartum (Primary) Normal anatomy Reviewed delivery planning  2. Primigravida of advanced maternal age in second trimester  3. Language barrier Nepali interpretor used  4. [redacted] weeks gestation of pregnancy  5. Urinary tract infection without hematuria, site unspecified Asymptomatic culture 2 months ago, did not take abx, will recollect - Urine Culture    Preterm labor symptoms and general obstetric precautions including but not limited to vaginal bleeding, contractions, leaking of fluid and fetal movement were reviewed in detail with the patient. Please refer to After Visit Summary for other counseling recommendations.   Return in about 1 month (around 01/05/2024) for low OB.  Future Appointments  Date Time Provider Department Center  02/23/2024  9:15 AM [facility] PROVIDER 1 [facility]  02/23/2024  9:30 AM [facility] US3 [facility]    Burnard CHRISTELLA Moats, MD

## 2023-12-08 ENCOUNTER — Ambulatory Visit: Payer: Self-pay | Admitting: Obstetrics and Gynecology

## 2023-12-08 LAB — URINE CULTURE: Organism ID, Bacteria: NO GROWTH

## 2024-01-04 ENCOUNTER — Ambulatory Visit: Admitting: Certified Nurse Midwife

## 2024-01-04 ENCOUNTER — Encounter: Payer: Self-pay | Admitting: Certified Nurse Midwife

## 2024-01-04 ENCOUNTER — Other Ambulatory Visit: Payer: Self-pay

## 2024-01-04 VITALS — BP 113/68 | HR 99 | Wt 129.8 lb

## 2024-01-04 DIAGNOSIS — Z758 Other problems related to medical facilities and other health care: Secondary | ICD-10-CM | POA: Diagnosis not present

## 2024-01-04 DIAGNOSIS — O0990 Supervision of high risk pregnancy, unspecified, unspecified trimester: Secondary | ICD-10-CM

## 2024-01-04 DIAGNOSIS — O0992 Supervision of high risk pregnancy, unspecified, second trimester: Secondary | ICD-10-CM

## 2024-01-04 DIAGNOSIS — Z3A24 24 weeks gestation of pregnancy: Secondary | ICD-10-CM | POA: Diagnosis not present

## 2024-01-04 DIAGNOSIS — O09512 Supervision of elderly primigravida, second trimester: Secondary | ICD-10-CM

## 2024-01-04 DIAGNOSIS — Z603 Acculturation difficulty: Secondary | ICD-10-CM

## 2024-01-04 NOTE — Progress Notes (Signed)
 PRENATAL VISIT NOTE  Subjective:  Brooke Hobbs is a 38 y.o. G1P0000 at [redacted]w[redacted]d being seen today for ongoing prenatal care.  She is currently monitored for the following issues for this low-risk pregnancy and has Language barrier; Infertility, female; Supervision of high risk pregnancy, antepartum; and AMA (advanced maternal age) primigravida 35+ on their problem list.  Patient reports no complaints.  Contractions: Not present. Vag. Bleeding: None.  Movement: Present. Denies leaking of fluid.   The following portions of the patient's history were reviewed and updated as appropriate: allergies, current medications, past family history, past medical history, past social history, past surgical history and problem list.   Objective:   Vitals:   01/04/24 1420  BP: 113/68  Pulse: 99  Weight: 129 lb 12.8 oz (58.9 kg)    Fetal Status:  Fetal Heart Rate (bpm): 140 Fundal Height: 25 cm Movement: Present    General: Alert, oriented and cooperative. Patient is in no acute distress.  Skin: Skin is warm and dry. No rash noted.   Cardiovascular: Normal heart rate noted  Respiratory: Normal respiratory effort, no problems with respiration noted  Abdomen: Soft, gravid, appropriate for gestational age.  Pain/Pressure: Absent     Pelvic: Cervical exam deferred        Extremities: Normal range of motion.  Edema: None  Mental Status: Normal mood and affect. Normal behavior. Normal judgment and thought content.      10/12/2023    3:43 PM 11/03/2021    2:54 PM 08/18/2021    8:01 AM  Depression screen PHQ 2/9  Decreased Interest 0 1 0  Down, Depressed, Hopeless 0 3 0  PHQ - 2 Score 0 4 0  Altered sleeping 0 3   Tired, decreased energy 0 3   Change in appetite 0 0   Feeling bad or failure about yourself  0 3   Trouble concentrating 0 0   Moving slowly or fidgety/restless 0 0   Suicidal thoughts 0 0   PHQ-9 Score 0  13       Data saved with a previous flowsheet row definition         10/12/2023    3:44 PM 11/03/2021    2:54 PM 08/07/2020    5:08 PM 10/12/2019   10:15 AM  GAD 7 : Generalized Anxiety Score  Nervous, Anxious, on Edge 0 3 2 1   Control/stop worrying 0 1 0 0  Worry too much - different things 0 2 0 0  Trouble relaxing 0 2 0 2  Restless 0 1 0 0  Easily annoyed or irritable 0 1 2 0  Afraid - awful might happen 0 2 1 0  Total GAD 7 Score 0 12 5 3     Assessment and Plan:  Pregnancy: G1P0000 at [redacted]w[redacted]d 1. Supervision of high risk pregnancy, antepartum (Primary) - Doing well, feeling regular and vigorous fetal movement  2. [redacted] weeks gestation of pregnancy - Routine PNC, anticipatory guidance.   3. Primigravida of advanced maternal age in second trimester - MFM US  at 32 weeks - Appropriate FH today.   4. Language barrier - In person Nepali interpreter used for entire episode of care.    Preterm labor symptoms and general obstetric precautions including but not limited to vaginal bleeding, contractions, leaking of fluid and fetal movement were reviewed in detail with the patient. Please refer to After Visit Summary for other counseling recommendations.   Return in about 4 weeks (around 02/01/2024) for LOB.  Future Appointments  Date Time Provider Department Center  02/03/2024  8:15 AM Zina Jerilynn LABOR, MD [facility]  02/03/2024  9:00 AM [facility] LAB [facility]  02/23/2024  9:15 AM [facility] PROVIDER 1 [facility]  02/23/2024  9:30 AM [facility] US3 [facility]    Camie LABOR Rote, CNM

## 2024-02-02 ENCOUNTER — Other Ambulatory Visit: Payer: Self-pay

## 2024-02-02 DIAGNOSIS — O0990 Supervision of high risk pregnancy, unspecified, unspecified trimester: Secondary | ICD-10-CM

## 2024-02-03 ENCOUNTER — Ambulatory Visit: Payer: Self-pay | Admitting: Obstetrics and Gynecology

## 2024-02-03 ENCOUNTER — Other Ambulatory Visit: Payer: Self-pay

## 2024-02-03 VITALS — BP 118/73 | HR 94 | Wt 130.5 lb

## 2024-02-03 DIAGNOSIS — O09513 Supervision of elderly primigravida, third trimester: Secondary | ICD-10-CM

## 2024-02-03 DIAGNOSIS — Z758 Other problems related to medical facilities and other health care: Secondary | ICD-10-CM | POA: Diagnosis not present

## 2024-02-03 DIAGNOSIS — Z3A28 28 weeks gestation of pregnancy: Secondary | ICD-10-CM | POA: Diagnosis not present

## 2024-02-03 DIAGNOSIS — O0993 Supervision of high risk pregnancy, unspecified, third trimester: Secondary | ICD-10-CM | POA: Diagnosis not present

## 2024-02-03 DIAGNOSIS — Z603 Acculturation difficulty: Secondary | ICD-10-CM

## 2024-02-03 DIAGNOSIS — O0990 Supervision of high risk pregnancy, unspecified, unspecified trimester: Secondary | ICD-10-CM

## 2024-02-03 NOTE — Progress Notes (Signed)
" ° °  PRENATAL VISIT NOTE  Subjective:  Brooke Hobbs is a 39 y.o. G1P0000 at [redacted]w[redacted]d being seen today for ongoing prenatal care.  She is currently monitored for the following issues for this high-risk pregnancy and has Language barrier; Infertility, female; Supervision of high risk pregnancy, antepartum; and AMA (advanced maternal age) primigravida 35+ on their problem list.  Patient doing well with no acute concerns today. She reports no complaints.   . Vag. Bleeding: None.  Movement: Present. Denies leaking of fluid.   The following portions of the patient's history were reviewed and updated as appropriate: allergies, current medications, past family history, past medical history, past social history, past surgical history and problem list. Problem list updated.  Objective:   Vitals:   02/03/24 0844  BP: 118/73  Pulse: 94  Weight: 130 lb 8 oz (59.2 kg)    Fetal Status: Fetal Heart Rate (bpm): 156 Fundal Height: 28 cm Movement: Present     General:  Alert, oriented and cooperative. Patient is in no acute distress.  Skin: Skin is warm and dry. No rash noted.   Cardiovascular: Normal heart rate noted  Respiratory: Normal respiratory effort, no problems with respiration noted  Abdomen: Soft, gravid, appropriate for gestational age.  Pain/Pressure: Absent     Pelvic: Cervical exam deferred        Extremities: Normal range of motion.     Mental Status:  Normal mood and affect. Normal behavior. Normal judgment and thought content.   Assessment and Plan:  Pregnancy: G1P0000 at [redacted]w[redacted]d  1. [redacted] weeks gestation of pregnancy (Primary)   2. Supervision of high risk pregnancy, antepartum Continue routine prenatal care  3. Language barrier Live interpreter present  4. Primigravida of advanced maternal age in third trimester   Preterm labor symptoms and general obstetric precautions including but not limited to vaginal bleeding, contractions, leaking of fluid and fetal movement were  reviewed in detail with the patient.  Please refer to After Visit Summary for other counseling recommendations.   Return in about 2 weeks (around 02/17/2024) for [facility], in person.   Jerilynn Buddle, MD Faculty Attending Center for [facility]   "

## 2024-02-04 LAB — GLUCOSE TOLERANCE, 2 HOURS W/ 1HR
Glucose, 1 hour: 174 mg/dL (ref 70–179)
Glucose, 2 hour: 92 mg/dL (ref 70–152)
Glucose, Fasting: 86 mg/dL (ref 70–91)

## 2024-02-04 LAB — CBC
Hematocrit: 31.5 % — ABNORMAL LOW (ref 34.0–46.6)
Hemoglobin: 9.4 g/dL — ABNORMAL LOW (ref 11.1–15.9)
MCH: 23.1 pg — ABNORMAL LOW (ref 26.6–33.0)
MCHC: 29.8 g/dL — ABNORMAL LOW (ref 31.5–35.7)
MCV: 77 fL — ABNORMAL LOW (ref 79–97)
Platelets: 370 x10E3/uL (ref 150–450)
RBC: 4.07 x10E6/uL (ref 3.77–5.28)
RDW: 15.3 % (ref 11.7–15.4)
WBC: 10 x10E3/uL (ref 3.4–10.8)

## 2024-02-04 LAB — HIV ANTIBODY (ROUTINE TESTING W REFLEX): HIV Screen 4th Generation wRfx: NONREACTIVE

## 2024-02-04 LAB — SYPHILIS: RPR W/REFLEX TO RPR TITER AND TREPONEMAL ANTIBODIES, TRADITIONAL SCREENING AND DIAGNOSIS ALGORITHM: RPR Ser Ql: NONREACTIVE

## 2024-02-08 ENCOUNTER — Ambulatory Visit: Payer: Self-pay | Admitting: Obstetrics and Gynecology

## 2024-02-17 ENCOUNTER — Other Ambulatory Visit: Payer: Self-pay | Admitting: Family Medicine

## 2024-02-17 ENCOUNTER — Ambulatory Visit: Payer: Self-pay | Admitting: Obstetrics & Gynecology

## 2024-02-17 ENCOUNTER — Other Ambulatory Visit: Payer: Self-pay

## 2024-02-17 VITALS — BP 109/67 | HR 111 | Wt 132.3 lb

## 2024-02-17 DIAGNOSIS — Z603 Acculturation difficulty: Secondary | ICD-10-CM

## 2024-02-17 DIAGNOSIS — O09513 Supervision of elderly primigravida, third trimester: Secondary | ICD-10-CM

## 2024-02-17 DIAGNOSIS — Z23 Encounter for immunization: Secondary | ICD-10-CM | POA: Diagnosis not present

## 2024-02-17 DIAGNOSIS — Z3A30 30 weeks gestation of pregnancy: Secondary | ICD-10-CM | POA: Diagnosis not present

## 2024-02-17 DIAGNOSIS — O2686 Pruritic urticarial papules and plaques of pregnancy (PUPPP): Secondary | ICD-10-CM

## 2024-02-17 DIAGNOSIS — O0993 Supervision of high risk pregnancy, unspecified, third trimester: Secondary | ICD-10-CM | POA: Diagnosis not present

## 2024-02-17 DIAGNOSIS — O0990 Supervision of high risk pregnancy, unspecified, unspecified trimester: Secondary | ICD-10-CM

## 2024-02-17 DIAGNOSIS — Z758 Other problems related to medical facilities and other health care: Secondary | ICD-10-CM | POA: Diagnosis not present

## 2024-02-17 DIAGNOSIS — L309 Dermatitis, unspecified: Secondary | ICD-10-CM

## 2024-02-17 MED ORDER — FERROUS SULFATE 325 (65 FE) MG PO TABS: 325.0000 mg | ORAL_TABLET | Freq: Every day | ORAL | 3 refills | Status: AC

## 2024-02-17 NOTE — Progress Notes (Signed)
 "  PRENATAL VISIT NOTE  Subjective:  Brooke Hobbs is a 39 y.o. G1P0000 at [redacted]w[redacted]d being seen today for ongoing prenatal care.  She is currently monitored for the following issues for this low-risk pregnancy and has Language barrier; Infertility, female; Supervision of high risk pregnancy, antepartum; and AMA (advanced maternal age) primigravida 35+ on their problem list.  Patient reports rash on arms and legs with itchin, using triamcinolone  cream with relief.  Contractions: Not present. Vag. Bleeding: None.  Movement: Present. Denies leaking of fluid.   The following portions of the patient's history were reviewed and updated as appropriate: allergies, current medications, past family history, past medical history, past social history, past surgical history and problem list.   Objective:   Vitals:   02/17/24 1138  BP: 109/67  Pulse: (!) 111  Weight: 132 lb 4.8 oz (60 kg)    Fetal Status:  Fetal Heart Rate (bpm): 156   Movement: Present    General: Alert, oriented and cooperative. Patient is in no acute distress.  Skin: Skin is warm and dry. No rash noted.   Cardiovascular: Normal heart rate noted  Respiratory: Normal respiratory effort, no problems with respiration noted  Abdomen: Soft, gravid, appropriate for gestational age.  Pain/Pressure: Absent     Pelvic: Cervical exam deferred        Extremities: Normal range of motion.  Edema: None  Mental Status: Normal mood and affect. Normal behavior. Normal judgment and thought content.      10/12/2023    3:43 PM 11/03/2021    2:54 PM 08/18/2021    8:01 AM  Depression screen PHQ 2/9  Decreased Interest 0 1 0  Down, Depressed, Hopeless 0 3 0  PHQ - 2 Score 0 4 0  Altered sleeping 0 3   Tired, decreased energy 0 3   Change in appetite 0 0   Feeling bad or failure about yourself  0 3   Trouble concentrating 0 0   Moving slowly or fidgety/restless 0 0   Suicidal thoughts 0 0   PHQ-9 Score 0  13       Data saved with a  previous flowsheet row definition        10/12/2023    3:44 PM 11/03/2021    2:54 PM 08/07/2020    5:08 PM 10/12/2019   10:15 AM  GAD 7 : Generalized Anxiety Score  Nervous, Anxious, on Edge 0  3  2  1    Control/stop worrying 0  1  0  0   Worry too much - different things 0  2  0  0   Trouble relaxing 0  2  0  2   Restless 0  1  0  0   Easily annoyed or irritable 0  1  2  0   Afraid - awful might happen 0  2  1  0   Total GAD 7 Score 0 12 5 3      Data saved with a previous flowsheet row definition    Assessment and Plan:  Pregnancy: G1P0000 at [redacted]w[redacted]d 1. Supervision of high risk pregnancy, antepartum (Primary) Suspect PUPPP with pruritic papules on extremities  2. Need for diphtheria-tetanus-pertussis (Tdap) vaccine Given today  Preterm labor symptoms and general obstetric precautions including but not limited to vaginal bleeding, contractions, leaking of fluid and fetal movement were reviewed in detail with the patient. Please refer to After Visit Summary for other counseling recommendations.   Return in about 2 weeks (around  03/02/2024).  Future Appointments  Date Time Provider Department Center  02/23/2024  9:15 AM [facility] PROVIDER 1 [facility]  02/23/2024  9:30 AM [facility] US3 [facility]    Lynwood Solomons, MD "

## 2024-02-23 ENCOUNTER — Ambulatory Visit

## 2024-02-23 ENCOUNTER — Ambulatory Visit (HOSPITAL_BASED_OUTPATIENT_CLINIC_OR_DEPARTMENT_OTHER): Admitting: Obstetrics

## 2024-02-23 VITALS — BP 116/70 | HR 127

## 2024-02-23 DIAGNOSIS — O09522 Supervision of elderly multigravida, second trimester: Secondary | ICD-10-CM

## 2024-02-23 DIAGNOSIS — O0990 Supervision of high risk pregnancy, unspecified, unspecified trimester: Secondary | ICD-10-CM | POA: Diagnosis not present

## 2024-02-23 DIAGNOSIS — Z3A31 31 weeks gestation of pregnancy: Secondary | ICD-10-CM

## 2024-02-23 DIAGNOSIS — O09513 Supervision of elderly primigravida, third trimester: Secondary | ICD-10-CM

## 2024-02-23 NOTE — Progress Notes (Signed)
 MFM Consult Note  Brooke Hobbs is currently at [redacted]w[redacted]d. She has been followed due to advanced maternal age (39 years old).    She denies any problems since her last exam and has screened negative for gestational diabetes.    Her blood pressure today was 116/70.  Sonographic findings Single intrauterine pregnancy at 31w 5d.  Fetal cardiac activity:  Observed and appears normal. Presentation: Breech. Fetal biometry shows the estimated fetal weight of 4 lb 1 oz,  1833g (40%). Amniotic fluid volume: Within normal limits. AFI: 12.79cm.  MVP: 3.78 cm. Placenta: Posterior.  As the fetal growth is within normal limits and there are no other complications in her pregnancy, she was advised to continue routine prenatal care and await the spontaneous onset of labor for delivery.    No further exams were scheduled in our office.    All conversations were held with the patient today with the help of a Nepalese interpreter.  The patient stated that all of her questions were answered.   A total of 20 minutes was spent counseling and coordinating the care for this patient.  Greater than 50% of the time was spent in direct face-to-face contact.

## 2024-03-03 ENCOUNTER — Encounter: Payer: Self-pay | Admitting: Student

## 2024-03-06 ENCOUNTER — Encounter: Admitting: Obstetrics and Gynecology
